# Patient Record
Sex: Male | Born: 1980 | Race: White | Hispanic: No | Marital: Single | Smoking: Never smoker
Health system: Southern US, Community
[De-identification: ages and names within clinical notes are randomized; demographics above are authoritative.]

## PROBLEM LIST (undated history)

## (undated) DIAGNOSIS — IMO0002 Reserved for concepts with insufficient information to code with codable children: Secondary | ICD-10-CM

## (undated) DIAGNOSIS — T7840XA Allergy, unspecified, initial encounter: Secondary | ICD-10-CM

## (undated) DIAGNOSIS — A4902 Methicillin resistant Staphylococcus aureus infection, unspecified site: Secondary | ICD-10-CM

## (undated) HISTORY — DX: Methicillin resistant Staphylococcus aureus infection, unspecified site: A49.02

## (undated) HISTORY — PX: WISDOM TOOTH EXTRACTION: SHX21

## (undated) HISTORY — DX: Allergy, unspecified, initial encounter: T78.40XA

## (undated) HISTORY — PX: HAND SURGERY: SHX662

---

## 2006-09-12 ENCOUNTER — Emergency Department (HOSPITAL_COMMUNITY): Admission: EM | Admit: 2006-09-12 | Discharge: 2006-09-13 | Payer: Self-pay | Admitting: Emergency Medicine

## 2006-10-15 ENCOUNTER — Emergency Department (HOSPITAL_COMMUNITY): Admission: EM | Admit: 2006-10-15 | Discharge: 2006-10-15 | Payer: Self-pay | Admitting: Emergency Medicine

## 2006-11-19 ENCOUNTER — Ambulatory Visit: Payer: Self-pay | Admitting: Internal Medicine

## 2006-11-20 ENCOUNTER — Ambulatory Visit: Payer: Self-pay | Admitting: Internal Medicine

## 2008-02-25 DIAGNOSIS — J45909 Unspecified asthma, uncomplicated: Secondary | ICD-10-CM | POA: Insufficient documentation

## 2008-02-25 DIAGNOSIS — J309 Allergic rhinitis, unspecified: Secondary | ICD-10-CM | POA: Insufficient documentation

## 2008-02-26 ENCOUNTER — Ambulatory Visit: Payer: Self-pay | Admitting: Internal Medicine

## 2008-02-26 DIAGNOSIS — R079 Chest pain, unspecified: Secondary | ICD-10-CM

## 2008-10-18 ENCOUNTER — Emergency Department: Payer: Self-pay | Admitting: Emergency Medicine

## 2008-10-20 ENCOUNTER — Ambulatory Visit: Payer: Self-pay | Admitting: General Practice

## 2009-01-22 ENCOUNTER — Emergency Department (HOSPITAL_COMMUNITY): Admission: EM | Admit: 2009-01-22 | Discharge: 2009-01-22 | Payer: Self-pay | Admitting: Emergency Medicine

## 2010-07-17 ENCOUNTER — Emergency Department (HOSPITAL_COMMUNITY)
Admission: EM | Admit: 2010-07-17 | Discharge: 2010-07-17 | Payer: Self-pay | Source: Home / Self Care | Admitting: Emergency Medicine

## 2010-09-12 ENCOUNTER — Other Ambulatory Visit: Payer: Self-pay | Admitting: Otolaryngology

## 2010-10-02 LAB — POCT CARDIAC MARKERS: Myoglobin, poc: 65.1 ng/mL (ref 12–200)

## 2010-10-02 LAB — POCT I-STAT, CHEM 8
Calcium, Ion: 1.14 mmol/L (ref 1.12–1.32)
HCT: 45 % (ref 39.0–52.0)
Sodium: 140 mEq/L (ref 135–145)
TCO2: 26 mmol/L (ref 0–100)

## 2010-12-08 NOTE — Assessment & Plan Note (Signed)
[facility]                             PULMONARY OFFICE NOTE   NAME:CLAPPFernandez, Kenley                         MRN:          [id number]  DATE:11/19/2006                            DOB:          15-Feb-1981    REFERRING PHYSICIAN:  Cristy Hilts. Jacinto Halim, MD   PULMONARY CONSULTATION:   HISTORY:  [age]-year-old white male who reports that he had  difficulty with speed-skating dating all the way back to age 30 and  never had the wind his peers did.  In his teens, he began having  itching and sneezing most in the spring and seemed somewhat better after  using Claritin or Allegra.  He started smoking in his teenage years and  stopped smoking in January 2008 after developing paroxysms of dyspnea  associated with cough and chest discomfort during coughing paroxysms  that have improved somewhat after albuterol for several hours at a  time.  He underwent a cardiac evaluation for his chest pain and was  found to have a normal echo and stress test and is therefore seen at Dr.  Verl Dicker request for further evaluation of dyspnea, chest pain and cough.   PAST MEDICAL HISTORY:  Significant for the problems as outlined above.   ALLERGIES:  PENICILLIN.   MEDICATIONS:  1. Singulair 10 mg daily (which he thinks helps his rhinitis and      reduces his Claritin need, but does not improve his breathing).  2. Albuterol, which helps his breathing, but only for a few hours.   SOCIAL HISTORY:  He quit smoking in January 2008 and works in Teacher, English as a foreign language.   FAMILY HISTORY:  Significant for emphysema in maternal grandmother and  paternal grandmother.  His paternal grandfather had lung cancer.  They  were all smokers.  His mother has lupus.   REVIEW OF SYSTEMS:  Taken in detail on the worksheet and negative,  except for the fact that he does become numb in his extremities during  paroxysms of dyspnea.   PHYSICAL EXAMINATION:  This is an ambulatory white male who is a very  difficult and evasive historian and with somewhat of a peculiar affect  and attitude.  He is afebrile with stable vital signs.  HEENT:  Unremarkable.  Oropharynx is clear.  Dentition is intact.  Nasal  mucous membranes normal.  Ear canals are clear bilaterally.  NECK:  Supple without cervical adenopathy or tenderness.  Trachea is  midline.  No thyromegaly.  Lung fields are perfectly clear bilaterally to auscultation and  percussion and note that he has not used any albuterol in the last 24  hours.  CARDIAC:  Regular rate and rhythm without murmur, gallop or rub.  ABDOMEN:  Soft and benign.  EXTREMITIES:  Warm without calf tenderness, cyanosis, clubbing or edema.   IMPRESSION:  1. Lifelong poor exercise tolerance is probably nothing more than      conditioning, but may indicate a component of either chronic asthma      or exercise-induced asthma.  He has not actually ever tried using      albuterol right  before he exercises to see if there is any benefit.  2. Atypical chest pain midline and worse with coughing; it is probably      either musculoskeletal or reflux-related.  Reflux would be a nice      unifying diagnosis, as it might explain exercise-induced asthma      as well as the chest discomfort and the chronic cough.   I therefore recommended that he start AcipHex 20 mg daily before  breakfast for at least 2 weeks before returning for a methacholine  challenge test to definitely say whether or not asthma is present.  In  the meantime, he can certainly continue to use albuterol as needed; I  spent extra time teaching him how to use this effectively, but cautioned  him not to use it before he comes in for the methacholine challenge  test.   The fact that he gets numb around his mouth and that he gets paroxysms  of dyspnea that are not directly related to activity, season or time of  day or night associated with numbness also suggests to me, in addition  to the fact that he has an  unusual affect, that he may be  hyperventilating.  Of course, he may be hyperventilating related to the  sense that he is developing bronchospasm and so it is critical to help  this patient understand the difference between true asthma and  hyperventilation; a methacholine challenge test will serve this purpose  nicely and I have recommended that he proceed with it after 2 weeks of  acid suppression with AcipHex to reduce the risk of a false-positive  methacholine based on chronic reflux.     Charlaine Dalton. Sherene Sires, MD, [facility]  Electronically Signed    MBW/MedQ  DD: 11/19/2006  DT: 11/20/2006  Job #: [id number]   cc:   Cristy Hilts. Jacinto Halim, MD  [facility]

## 2012-05-01 ENCOUNTER — Ambulatory Visit (INDEPENDENT_AMBULATORY_CARE_PROVIDER_SITE_OTHER): Payer: 59 | Admitting: Family Medicine

## 2012-05-01 ENCOUNTER — Ambulatory Visit: Payer: 59

## 2012-05-01 VITALS — BP 124/70 | HR 60 | Temp 97.8°F | Resp 16 | Ht 70.0 in | Wt 152.0 lb

## 2012-05-01 DIAGNOSIS — R42 Dizziness and giddiness: Secondary | ICD-10-CM

## 2012-05-01 DIAGNOSIS — R51 Headache: Secondary | ICD-10-CM

## 2012-05-01 DIAGNOSIS — R110 Nausea: Secondary | ICD-10-CM

## 2012-05-01 DIAGNOSIS — R0602 Shortness of breath: Secondary | ICD-10-CM

## 2012-05-01 DIAGNOSIS — R079 Chest pain, unspecified: Secondary | ICD-10-CM

## 2012-05-01 LAB — POCT CBC
Granulocyte percent: 59.1 %G (ref 37–80)
HCT, POC: 50.3 % (ref 43.5–53.7)
Hemoglobin: 17.2 g/dL (ref 14.1–18.1)
Lymph, poc: 2.4 (ref 0.6–3.4)
MCH, POC: 32.8 pg — AB (ref 27–31.2)
MCHC: 34.2 g/dL (ref 31.8–35.4)
MCV: 95.9 fL (ref 80–97)
MID (cbc): 0.4 (ref 0–0.9)
MPV: 8.4 fL (ref 0–99.8)
POC Granulocyte: 4.1 (ref 2–6.9)
POC LYMPH PERCENT: 35.1 % (ref 10–50)
POC MID %: 5.8 %M (ref 0–12)
Platelet Count, POC: 281 10*3/uL (ref 142–424)
RBC: 5.25 M/uL (ref 4.69–6.13)
RDW, POC: 12.3 %
WBC: 6.9 10*3/uL (ref 4.6–10.2)

## 2012-05-01 LAB — POCT URINALYSIS DIPSTICK
Bilirubin, UA: NEGATIVE
Blood, UA: NEGATIVE
Glucose, UA: NEGATIVE
Ketones, UA: NEGATIVE
Leukocytes, UA: NEGATIVE
Nitrite, UA: NEGATIVE
Protein, UA: NEGATIVE
Spec Grav, UA: 1.01
Urobilinogen, UA: 0.2
pH, UA: 6.5

## 2012-05-01 LAB — POCT UA - MICROSCOPIC ONLY
Bacteria, U Microscopic: NEGATIVE
Casts, Ur, LPF, POC: NEGATIVE
Crystals, Ur, HPF, POC: NEGATIVE
Epithelial cells, urine per micros: NEGATIVE
Mucus, UA: NEGATIVE
RBC, urine, microscopic: NEGATIVE
WBC, Ur, HPF, POC: NEGATIVE
Yeast, UA: NEGATIVE

## 2012-05-01 LAB — GLUCOSE, POCT (MANUAL RESULT ENTRY): POC Glucose: 83 mg/dl (ref 70–99)

## 2012-05-01 NOTE — Progress Notes (Signed)
[facility]:  Office Visit  Chief Complaint:  Chief Complaint  Patient presents with  . Dizziness    Pt at work earlier and felt very nauseated, jittery, lightheaded- pulse was in the 50's  . Headache  . Numbness    Lt arm numbness    HPI: William Oliver is a 31 y.o. male who complains of  Acute onset of dizziness and nausea this morning at work at 11:30 and started having blurred vision , headache, left anterior wall chest pain, numbness, tingling, generalized weakness lasting several seconds to minutes. Patient is currently asymptomatic.  Denies personal h/o tobacco use, HTN, DM, or XOL. Has a materna uncle who passed away from MI at age 48. BP was 128/80 and pulse 63 when measure at work.  Eating and drinking well. Denies drug use either illicit or prescribed.  He has never had sxs like this before but has had localized anterior chest wall pain in 2008/2009 where he got a stress test which was negative. He also had seen a pulmonologist for a 2008 xray which stated that he had COPD. The pulmonogist did not think he had/has COPD per patient and they parted ways. He has been a non smoker. There is a family hx of COPD, father and grandfather who are both welders and are life long non-smokers. Father has throat cancer and grandfather had lung cancer.   History reviewed. No pertinent past medical history. History reviewed. No pertinent past surgical history. History   Social History  . Marital Status: Single    Spouse Name: N/A    Number of Children: N/A  . Years of Education: N/A   Social History Main Topics  . Smoking status: Never Smoker   . Smokeless tobacco: None  . Alcohol Use: No  . Drug Use: No  . Sexually Active: None   Other Topics Concern  . None   Social History Narrative  . None   Family History  Problem Relation Age of Onset  . Lupus Mother   . COPD Father   . Heart disease Maternal Uncle    Allergies  Allergen Reactions  . Penicillins     Prior to Admission medications   Not on File     ROS: The patient denies fevers, chills, night sweats, unintentional weight loss,  wheezing, dyspnea on exertion,vomiting, abdominal pain, dysuria, hematuria, melena; + increase urinary frequency, CP, dizziness, numbness, weakness, or tingling.   All other systems have been reviewed and were otherwise negative with the exception of those mentioned in the HPI and as above.    PHYSICAL EXAM: Filed Vitals:   05/01/12 1454  BP: 124/70  Pulse:   Temp:   Resp:    Filed Vitals:   05/01/12 1412  Height: 5\' 10"  (1.778 m)  Weight: 152 lb (68.947 kg)   Body mass index is 21.81 kg/(m^2).  General: Alert, no acute distress.  Affect a little strange but otherwise ok. HEENT:  Normocephalic, atraumatic, oropharynx patent. EOMI, fundoscopic exam nl Cardiovascular:  Regular rate and rhythm, no rubs murmurs or gallops.  No Carotid bruits, radial pulse intact. No pedal edema.  Respiratory: Clear to auscultation bilaterally.  No wheezes, rales, or rhonchi.  No cyanosis, no use of accessory musculature GI: No organomegaly, abdomen is soft and non-tender, positive bowel sounds.  No masses. Skin: No rashes. Neurologic: Facial musculature symmetric. Psychiatric: Patient is appropriate throughout our interaction. Lymphatic: No cervical lymphadenopathy Musculoskeletal: Gait intact.   LABS: Results for orders placed  in visit on 05/01/12  POCT CBC      Component Value Range   WBC 6.9  4.6 - 10.2 K/uL   Lymph, poc 2.4  0.6 - 3.4   POC LYMPH PERCENT 35.1  10 - 50 %L   MID (cbc) 0.4  0 - 0.9   POC MID % 5.8  0 - 12 %M   POC Granulocyte 4.1  2 - 6.9   Granulocyte percent 59.1  37 - 80 %G   RBC 5.25  4.69 - 6.13 M/uL   Hemoglobin 17.2  14.1 - 18.1 g/dL   HCT, POC 29.5  62.1 - 53.7 %   MCV 95.9  80 - 97 fL   MCH, POC 32.8 (*) 27 - 31.2 pg   MCHC 34.2  31.8 - 35.4 g/dL   RDW, POC 30.8     Platelet Count, POC 281  142 - 424 K/uL   MPV 8.4  0 -  99.8 fL  POCT UA - MICROSCOPIC ONLY      Component Value Range   WBC, Ur, HPF, POC neg     RBC, urine, microscopic neg     Bacteria, U Microscopic neg     Mucus, UA neg     Epithelial cells, urine per micros neg     Crystals, Ur, HPF, POC neg     Casts, Ur, LPF, POC neg     Yeast, UA neg    POCT URINALYSIS DIPSTICK      Component Value Range   Color, UA yellow     Clarity, UA clear     Glucose, UA neg     Bilirubin, UA neg     Ketones, UA neg     Spec Grav, UA 1.010     Blood, UA neg     pH, UA 6.5     Protein, UA neg     Urobilinogen, UA 0.2     Nitrite, UA neg     Leukocytes, UA Negative    GLUCOSE, POCT (MANUAL RESULT ENTRY)      Component Value Range   POC Glucose 83  70 - 99 mg/dl     EKG/XRAY:   Primary read interpreted by Dr. Conley Rolls at [facility]. Sinus brady otherwise no ST-T wave abnormalites No acute cardiopulmonary process  On CXR, unchanged from prior  ASSESSMENT/PLAN: Encounter Diagnoses  Name Primary?  . Chest pain Yes  . Nausea   . Dizziness   . Headache   . SOB (shortness of breath)    1. Chest pain-acute on chronic d/t costchondritis vs pulmonary vs less likely cardiac. Cardiac workup in 2009  Negative. Go to ER prn worsening CP. EKG sinus brady-young and exercising. Will refer him back to [facility] for PFTs if he has not done it. He is  slightly fixated on dx of COPD on xray from 2008 although has been told by pulmonology that is not likely. 2. Dizzness-since 1 episode for a few seconds without LOC  then will ask to monitor.  3. H/O COPD? Will refer to pulmonolgy for definitve diagnosis. He had a dx of COPD based on xray and not any other pulmonary testing based from what he can tell me. He wants to go see another pulmonologist. Was supposed to get a PFT? With Dr. Sherene Sires but never did. Will refer him to Dr. Stann Mainland.     Emer Onnen PHUONG, DO 05/01/2012 3:59 PM

## 2012-05-02 LAB — COMPREHENSIVE METABOLIC PANEL
AST: 23 U/L (ref 0–37)
Albumin: 5.1 g/dL (ref 3.5–5.2)
Alkaline Phosphatase: 51 U/L (ref 39–117)
Glucose, Bld: 85 mg/dL (ref 70–99)
Potassium: 4.3 mEq/L (ref 3.5–5.3)
Sodium: 138 mEq/L (ref 135–145)
Total Protein: 7.9 g/dL (ref 6.0–8.3)

## 2012-05-02 LAB — COMPREHENSIVE METABOLIC PANEL WITH GFR
ALT: 17 U/L (ref 0–53)
BUN: 15 mg/dL (ref 6–23)
CO2: 31 meq/L (ref 19–32)
Calcium: 10 mg/dL (ref 8.4–10.5)
Chloride: 100 meq/L (ref 96–112)
Creat: 0.91 mg/dL (ref 0.50–1.35)
Total Bilirubin: 1 mg/dL (ref 0.3–1.2)

## 2012-05-02 LAB — TSH: TSH: 1.049 u[IU]/mL (ref 0.350–4.500)

## 2012-05-09 ENCOUNTER — Ambulatory Visit (INDEPENDENT_AMBULATORY_CARE_PROVIDER_SITE_OTHER): Payer: 59 | Admitting: Internal Medicine

## 2012-05-09 ENCOUNTER — Encounter: Payer: Self-pay | Admitting: Internal Medicine

## 2012-05-09 VITALS — BP 114/70 | HR 75 | Temp 97.6°F | Ht 71.0 in | Wt 151.0 lb

## 2012-05-09 DIAGNOSIS — R079 Chest pain, unspecified: Secondary | ICD-10-CM

## 2012-05-09 DIAGNOSIS — R05 Cough: Secondary | ICD-10-CM

## 2012-05-09 DIAGNOSIS — R0600 Dyspnea, unspecified: Secondary | ICD-10-CM

## 2012-05-09 DIAGNOSIS — R0989 Other specified symptoms and signs involving the circulatory and respiratory systems: Secondary | ICD-10-CM

## 2012-05-09 MED ORDER — FAMOTIDINE 20 MG PO TABS: ORAL_TABLET | ORAL | Status: DC

## 2012-05-09 MED ORDER — PANTOPRAZOLE SODIUM 40 MG PO TBEC: 40.0000 mg | DELAYED_RELEASE_TABLET | Freq: Every day | ORAL | Status: DC

## 2012-05-09 NOTE — Patient Instructions (Addendum)
Stop lodine  Try Protonix 40 mg (pantoprazole) 40 mg  Take 30-60 min before first meal of the day and Pepcid 20 mg one bedtime until  Return  GERD (REFLUX)  is an extremely common cause of respiratory symptoms including cough and short of breath, many times with no significant heartburn at all.    It can be treated with medication, but also with lifestyle changes including avoidance of late meals, excessive alcohol, smoking cessation, and avoid fatty foods, chocolate, peppermint, colas, red wine, and acidic juices such as orange juice.  NO MINT OR MENTHOL PRODUCTS SO NO COUGH DROPS  USE SUGARLESS CANDY INSTEAD (jolley ranchers or Stover's)  NO OIL BASED VITAMINS - use powdered substitutes.    Classic for splenic flexure syndrome  very limited distribution of pain locations, daytime, not exacerbated by ex or coughing, worse in sitting position, associated with generalized abd bloating, not present supine due to the dome effect of the diaphragm is  canceled in that position. Frequently these patients have had multiple negative GI workups and CT scans.  Treatment consists of avoiding foods that cause gas (especially beans and raw vegetables like spinach and salads)  and citrucel 1 heaping tsp twice daily with a large glass of water.  Pain should improve w/in 2 weeks.  Please see patient coordinator before you leave today  to schedule sinus CT   Please schedule a follow up office visit in 2 weeks, sooner if needed with meds in hand

## 2012-05-09 NOTE — Progress Notes (Signed)
Subjective:    Patient ID: William Oliver, male    DOB: November 14, 1980  MRN: [id number]  HPI  31 yowm quit smoking 07/2006 who reports that he had  difficulty with "speed-skating" dating all the way back to age 31 and  never had the "wind" his peers did. In his teens, he began having  itching and sneezing most in the spring and seemed somewhat better after  using Claritin or Allegra. He started smoking in his teenage years and  stopped smoking in January 2008 after developing paroxysms of dyspnea  associated with cough and chest discomfort during coughing paroxysms  that have "improved somewhat" after albuterol for several hours at a  time. He underwent a cardiac evaluation for his chest pain and was  found to have a normal echo and stress test and is therefore seen at Dr.  Verl Dicker request for further evaluation of dyspnea, chest pain and cough.   11/19/06 1st pulmonary eval initially: dx upper airway cough syndrome either reflux or sinus related. Sinus CT scan on 11/20/07 was positive for maxillary sinusitis and he remembers taking antibiotics but not whether this helped any of his symptoms and did not return for follow-up because he ran out of insurance.   February 26, 2008 reports multiple complaints that are gradually worsening:  doe more than walk with dog , like jogging with dog > 5 min , without reproducible cp  siitting still also looses breath brought on by couging sometimes, several times a day, sometimes better with inhaler, sometimes not, out of inihaler x 1 week no worse  cough comes and goes wakes up at night , sometimes yellow mucus, no change with 2 weeks of abx for tooth infection and has two more pills left and had upper tooth pulled 02/26/08  cp comes and goes sev times worse with coughing also comes and goes without coughing from sev few minutes to a few hours never wakes up or with ex.  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 3) CP L of midline "constantly present" x  sometimes eases down when supine some worse with deep breath present x years, ? Worse sitting and bending over at work. No better on or off lodine  No obvious daytime variabilty or assoc  subjective wheeze overt sinus or hb symptoms. No unusual exp hx or h/o childhood pna/ asthma or premature birth to his knowledge.   Sleeping ok without nocturnal  or early am exacerbation  of respiratory  c/o's or need for noct saba. Also denies any obvious fluctuation of symptoms with weather or environmental changes or other aggravating or alleviating factors except as outlined above          PMHX Allergic Rhinitis  Asthma    Family History:   emphysema- mother, grandmother,siser  cancer-lung-grandfather  lupus-mother  Social History:  Reviewed history from 02/25/2008 and no changes required:  lives with dad  single, no children  quit smoking 1/08 worse since  Review of Systems  Constitutional: Negative for fever, chills, activity change, appetite change and unexpected weight change.  HENT: Positive for congestion. Negative for sore throat, rhinorrhea, sneezing, trouble swallowing, dental problem, voice change and postnasal drip.   Eyes: Negative for visual disturbance.  Respiratory: Positive for cough and shortness of breath. Negative for choking.   Cardiovascular: Positive for chest pain. Negative for leg swelling.  Gastrointestinal: Negative for nausea, vomiting and abdominal pain.  Genitourinary: Negative for difficulty urinating.    Musculoskeletal: Positive for arthralgias.  Skin: Negative for rash.  Psychiatric/Behavioral: Negative for behavioral problems and confusion.       Objective:   Physical Exam  Wt 151 05/09/12 somber and anxious ambulatory white male easily frustrated answering multiple symptom related questions  HEENT: nl dentition, turbinates, and orophanx. Nl external ear canals without cough reflex  Neck without JVD/Nodes/TM  Lungs clear to A and P bilaterally without cough on insp or exp maneuvers  RRR no s3 or murmur or increase in P2  Abd soft and benign with nl excursion in the supine position. No bruits or organomegaly  Ext warm without calf tenderness, cyanosis clubbing or edema  Skin warm and dry without lesions   05/01/12 cxr Negative, no acute cardiopulmonary abnormality     Assessment & Plan:

## 2012-05-11 DIAGNOSIS — R0600 Dyspnea, unspecified: Secondary | ICD-10-CM | POA: Insufficient documentation

## 2012-05-11 NOTE — Assessment & Plan Note (Signed)
Symptoms are markedly disproportionate to objective findings and not clear this is a lung problem but pt does appear to have difficult airway management issues. DDX of  difficult airways managment all start with A and  include Adherence, Ace Inhibitors, Acid Reflux, Active Sinus Disease, Alpha 1 Antitripsin deficiency, Anxiety masquerading as Airways dz,  ABPA,  allergy(esp in young), Aspiration (esp in elderly), Adverse effects of DPI,  Active smokers, plus two Bs  = Bronchiectasis and Beta blocker use..and one C= CHF  ? Acid reflux > repeat rx and stop lodine  ? Anxiety/depresssion> usually a dx of exclusion > note symptoms all worsened after stopped smoking so this raises the issue to a higher level but defer rx to Dr Conley Rolls  Does need baseline pft's as prev recommended

## 2012-05-11 NOTE — Assessment & Plan Note (Signed)
Classic subdiaphragmatic pain pattern suggests ibs:  Stereotypical,  chronic with a very limited distribution L anterior chest, daytime, not necessarily  exacerbated by ex or coughing, worse in sitting position, associated with generalized abd bloating, not present supine due to the dome effect of the diaphragm is  canceled in that position. Frequently these patients have had multiple negative GI workups and CT scans.  Treatment consists of avoiding foods that cause gas (especially beans and raw vegetables like spinach and salads)  and citrucel 1 heaping tsp twice daily with a large glass of water.  Pain should improve w/in 2 weeks and if not then consider further GI work up.

## 2012-05-11 NOTE — Assessment & Plan Note (Signed)
The most common causes of chronic cough in immunocompetent adults include the following: upper airway cough syndrome (UACS), previously referred to as postnasal drip syndrome (PNDS), which is caused by variety of rhinosinus conditions; (2) asthma; (3) GERD; (4) chronic bronchitis from cigarette smoking or other inhaled environmental irritants; (5) nonasthmatic eosinophilic bronchitis; and (6) bronchiectasis.   These conditions, singly or in combination, have accounted for up to 94% of the causes of chronic cough in prospective studies.   Other conditions have constituted no >6% of the causes in prospective studies These have included bronchogenic carcinoma, chronic interstitial pneumonia, sarcoidosis, left ventricular failure, ACEI-induced cough, and aspiration from a condition associated with pharyngeal dysfunction.   Most likely this is  Classic Upper airway cough syndrome, so named because it's frequently impossible to sort out how much is  CR/sinusitis with freq throat clearing (which can be related to primary GERD)   vs  causing  secondary (" extra esophageal")  GERD from wide swings in gastric pressure that occur with throat clearing, often  promoting self use of mint and menthol lozenges that reduce the lower esophageal sphincter tone and exacerbate the problem further in a cyclical fashion.   These are the same pts (now being labeled as having "irritable larynx syndrome" by some cough centers) who not infrequently have a history of having failed to tolerate ace inhibitors,  dry powder inhalers or biphosphonates or report having atypical reflux symptoms that don't respond to standard doses of PPI , and are easily confused as having aecopd or asthma flares by even experienced allergists/ pulmonologists.   Needs trial off ppi qam, h2 hs and repeat sinus ct as did previously have sign occult sinus dz

## 2012-05-13 ENCOUNTER — Other Ambulatory Visit: Payer: 59

## 2012-05-14 ENCOUNTER — Ambulatory Visit (INDEPENDENT_AMBULATORY_CARE_PROVIDER_SITE_OTHER)
Admission: RE | Admit: 2012-05-14 | Discharge: 2012-05-14 | Disposition: A | Discharge status: Home / Self Care | Payer: 59 | Source: Ambulatory Visit | Attending: Internal Medicine | Admitting: Internal Medicine

## 2012-05-14 DIAGNOSIS — R05 Cough: Secondary | ICD-10-CM

## 2012-05-15 ENCOUNTER — Encounter: Payer: Self-pay | Admitting: Internal Medicine

## 2012-05-22 ENCOUNTER — Encounter: Payer: Self-pay | Admitting: Family Medicine

## 2012-05-27 ENCOUNTER — Ambulatory Visit (INDEPENDENT_AMBULATORY_CARE_PROVIDER_SITE_OTHER): Payer: 59 | Admitting: Internal Medicine

## 2012-05-27 ENCOUNTER — Encounter: Payer: Self-pay | Admitting: Internal Medicine

## 2012-05-27 VITALS — BP 112/78 | HR 54 | Temp 98.1°F | Ht 71.5 in | Wt 160.6 lb

## 2012-05-27 DIAGNOSIS — R0600 Dyspnea, unspecified: Secondary | ICD-10-CM

## 2012-05-27 DIAGNOSIS — R05 Cough: Secondary | ICD-10-CM

## 2012-05-27 DIAGNOSIS — R0989 Other specified symptoms and signs involving the circulatory and respiratory systems: Secondary | ICD-10-CM

## 2012-05-27 DIAGNOSIS — R079 Chest pain, unspecified: Secondary | ICD-10-CM

## 2012-05-27 NOTE — Patient Instructions (Addendum)
Citrucel one heaping tsp twice daily with large glass of water.  Zostrix cream 4 x daily x one week and if improve taper to once daily  Call for appt  2 weeks if not improving.

## 2012-05-27 NOTE — Progress Notes (Signed)
Subjective:    Patient ID: William Oliver, male    DOB: Oct 06, 1980  MRN: [id number]  HPI  31 yowm quit smoking 07/2006 who reports that he had  difficulty with "speed-skating" dating all the way back to age 31 and  never had the "wind" his peers did. In his teens, he began having  itching and sneezing most in the spring and seemed somewhat better after  using Claritin or Allegra. He started smoking in his teenage years and  stopped smoking in January 2008 after developing paroxysms of dyspnea  associated with cough and chest discomfort during coughing paroxysms  that have "improved somewhat" after albuterol for several hours at a  time. He underwent a cardiac evaluation for his chest pain and was  found to have a normal echo and stress test and is therefore seen at Dr.  Verl Dicker request for further evaluation of dyspnea, chest pain and cough.   11/19/06 1st pulmonary eval initially: dx upper airway cough syndrome either reflux or sinus related. Sinus CT scan on 11/20/07 was positive for maxillary sinusitis and he remembers taking antibiotics but not whether this helped any of his symptoms and did not return for follow-up because he ran out of insurance.   February 26, 2008 reports multiple complaints that are gradually worsening:  doe more than walk with dog , like jogging with dog > 5 min , without reproducible cp  siitting still also looses breath brought on by couging sometimes, several times a day, sometimes better with inhaler, sometimes not, out of inihaler x 1 week no worse  cough comes and goes wakes up at night , sometimes yellow mucus, no change with 2 weeks of abx for tooth infection and has two more pills left and had upper tooth pulled 02/26/08  cp comes and goes sev times worse with coughing also comes and goes without coughing from sev few minutes to a few hours never wakes up or with ex.  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 3) CP L of midline "constantly present" x  sometimes eases down when supine some worse with deep breath present x years, ? Worse sitting and bending over at work. No better on or off lodine rec Stop lodine Try Protonix 40 mg (pantoprazole) 40 mg  Take 30-60 min before first meal of the day and Pepcid 20 mg one bedtime until  Return GERD diet Classic for splenic flexure syndrome   Treatment consists of avoiding foods that cause gas (especially beans and raw vegetables like spinach and salads)  and citrucel 1 heaping tsp twice daily with a large glass of water.  Pain should improve w/in 2 weeks.  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  No obvious daytime variabilty or assoc  subjective wheeze overt sinus or hb symptoms. No unusual exp hx or h/o childhood pna/ asthma or premature birth to his knowledge.   Sleeping ok without nocturnal  or early am exacerbation  of respiratory  c/o's or need for noct saba. Also denies any obvious fluctuation of symptoms with weather or environmental changes or other aggravating or  alleviating factors except as outlined above   ROS  The following are not active complaints unless bolded sore throat, dysphagia, dental problems, itching, sneezing,  nasal congestion or excess/ purulent secretions, ear ache,   fever, chills, sweats, unintended wt loss, pleuritic or exertional cp, hemoptysis,  orthopnea pnd or leg swelling, presyncope, palpitations, heartburn, abdominal pain, anorexia, nausea, vomiting, diarrhea  or change in bowel or urinary habits, change in stools or urine, dysuria,hematuria,  rash, arthralgias, visual complaints, headache, numbness weakness or ataxia or problems with walking or coordination,  change in mood/affect or memory.             PMHX Allergic Rhinitis  Asthma    Family History:   emphysema- mother, grandmother,siser  cancer-lung-grandfather  lupus-mother   Social History:  lives with dad  single, no children  quit smoking 1/08 worse since                   Objective:   Physical Exam  Wt 151 05/09/12 > 05/27/2012  89 somber and anxious ambulatory white male easily frustrated answering multiple symptom related questions and didn't answer a single one in a straightforward manner HEENT: nl dentition, turbinates, and orophanx. Nl external ear canals without cough reflex  Neck without JVD/Nodes/TM  Lungs clear to A and P bilaterally without cough on insp or exp maneuvers  RRR no s3 or murmur or increase in P2  Abd soft and benign with nl excursion in the supine position. No bruits or organomegaly  Ext warm without calf tenderness, cyanosis clubbing or edema  Skin warm and dry without lesions   05/01/12 cxr Negative, no acute cardiopulmonary abnormality     Assessment & Plan:

## 2012-05-28 NOTE — Assessment & Plan Note (Signed)
-   Sinus Ct 05/14/12> Mild chronic sinusitis, no acute changes    - Better on GERD Rx 05/28/2012 > rec maint rx  Most likely this is  Classic Upper airway cough syndrome, so named because it's frequently impossible to sort out how much is  CR/sinusitis with freq throat clearing (which can be related to primary GERD)   vs  causing  secondary (" extra esophageal")  GERD from wide swings in gastric pressure that occur with throat clearing, often  promoting self use of mint and menthol lozenges that reduce the lower esophageal sphincter tone and exacerbate the problem further in a cyclical fashion.   These are the same pts (now being labeled as having "irritable larynx syndrome" by some cough centers) who not infrequently have a history of having failed to tolerate ace inhibitors,  dry powder inhalers or biphosphonates or report having atypical reflux symptoms that don't respond to standard doses of PPI , and are easily confused as having aecopd or asthma flares by even experienced allergists/ pulmonologists.     Each maintenance medication was reviewed in detail including most importantly the difference between maintenance and as needed and under what circumstances the prns are to be used.  Please see instructions for details which were reviewed in writing and the patient given a copy.

## 2012-05-28 NOTE — Assessment & Plan Note (Signed)
Unable to reproduced this chronic "persistent" complaint in this building > back slowed him before his breathing.  Will likely need cpst at some point.

## 2012-05-28 NOTE — Assessment & Plan Note (Signed)
Now describes 2 different pains x 5 years  1) L of midlline above L Breast 24 h per day ? Better supine, worse crouched, ? Goes away when the other pain starts (ie migratory)  2) Below L breast, sev times weekly, sev hours each, never while supine  ddx is neuralgia pattern (? Chest wall injury from cough, his initial presentation in 2008) vs IBS > can't r/o unless he follows diet.

## 2012-06-05 ENCOUNTER — Ambulatory Visit (INDEPENDENT_AMBULATORY_CARE_PROVIDER_SITE_OTHER): Payer: 59 | Admitting: Emergency Medicine

## 2012-06-05 ENCOUNTER — Ambulatory Visit: Payer: 59

## 2012-06-05 VITALS — BP 126/76 | HR 90 | Temp 99.3°F | Resp 17 | Ht 70.5 in | Wt 152.0 lb

## 2012-06-05 DIAGNOSIS — R079 Chest pain, unspecified: Secondary | ICD-10-CM

## 2012-06-05 DIAGNOSIS — R509 Fever, unspecified: Secondary | ICD-10-CM

## 2012-06-05 DIAGNOSIS — R05 Cough: Secondary | ICD-10-CM

## 2012-06-05 DIAGNOSIS — H6690 Otitis media, unspecified, unspecified ear: Secondary | ICD-10-CM

## 2012-06-05 LAB — POCT CBC
Granulocyte percent: 75.3 %G (ref 37–80)
MID (cbc): 0.7 (ref 0–0.9)
MPV: 8.3 fL (ref 0–99.8)
POC MID %: 6.3 %M (ref 0–12)
Platelet Count, POC: 288 10*3/uL (ref 142–424)
RBC: 5.51 M/uL (ref 4.69–6.13)

## 2012-06-05 MED ORDER — HYDROCOD POLST-CHLORPHEN POLST 10-8 MG/5ML PO LQCR: 5.0000 mL | Freq: Two times a day (BID) | ORAL | Status: DC | PRN

## 2012-06-05 MED ORDER — AZITHROMYCIN 250 MG PO TABS: ORAL_TABLET | ORAL | Status: DC

## 2012-06-05 MED ORDER — BENZONATATE 100 MG PO CAPS: 100.0000 mg | ORAL_CAPSULE | Freq: Three times a day (TID) | ORAL | Status: DC | PRN

## 2012-06-05 NOTE — Progress Notes (Signed)
Subjective:    Patient ID: William Oliver, male    DOB: 08-30-1980, 31 y.o.   MRN: [id number]  HPI  William Oliver is a 31 yr old male with 4 days of sore throat, productive cough, and fever.  States symptoms began Sunday evening and have been worsening since.  No energy.  His cough is productive of green sputum.  He denies runny nose but has some nasal congestion and sinus pressure.  He is experiencing chest pain with deep inspiration.  Pain is located medially and does not radiate.  His sore throat is better than it was initially.  He has developed left ear pain.  Tmax at home 100.61F.  Has been using Dayquil, Nyquil, and Zicam which are helping to suppress the cough.    Denies smoking history but previous notes from pulmonology indicate he is a former smoker.  Review of Systems  Constitutional: Negative for fever and chills.  HENT: Positive for ear pain, congestion, sore throat and sinus pressure. Negative for rhinorrhea.   Respiratory: Positive for cough and shortness of breath. Negative for wheezing.   Cardiovascular: Positive for chest pain (with deep inspiration).  Gastrointestinal: Positive for nausea. Negative for vomiting and diarrhea.  Musculoskeletal: Negative.   Skin: Negative.   Neurological: Positive for dizziness, light-headedness and headaches. Negative for syncope.       Objective:   Physical Exam  Vitals reviewed. Constitutional: He is oriented to person, place, and time. He appears well-developed and well-nourished. No distress.  HENT:  Head: Normocephalic and atraumatic.  Right Ear: Ear canal normal. A middle ear effusion is present.  Left Ear: Ear canal normal. Tympanic membrane is erythematous and bulging.  Nose: Nose normal. Right sinus exhibits no maxillary sinus tenderness and no frontal sinus tenderness. Left sinus exhibits no maxillary sinus tenderness and no frontal sinus tenderness.  Mouth/Throat: Uvula is midline, oropharynx is clear and moist and mucous membranes  are normal.  Neck: Neck supple.  Cardiovascular: Normal rate, regular rhythm and normal heart sounds.  Exam reveals no gallop and no friction rub.   No murmur heard. Pulmonary/Chest: Effort normal and breath sounds normal. He has no wheezes. He has no rales. He exhibits no tenderness.  Lymphadenopathy:    He has no cervical adenopathy.  Neurological: He is alert and oriented to person, place, and time.  Skin: Skin is warm and dry.  Psychiatric: He has a normal mood and affect. His behavior is normal.     Filed Vitals:   06/05/12 0922  BP: 126/76  Pulse: 90  Temp: 99.3 F (37.4 C)  Resp: 17     Results for orders placed in visit on 06/05/12  POCT CBC      Component Value Range   WBC 11.6 (*) 4.6 - 10.2 K/uL   Lymph, poc 2.1  0.6 - 3.4   POC LYMPH PERCENT 18.4  10 - 50 %L   MID (cbc) 0.7  0 - 0.9   POC MID % 6.3  0 - 12 %M   POC Granulocyte 8.7 (*) 2 - 6.9   Granulocyte percent 75.3  37 - 80 %G   RBC 5.51  4.69 - 6.13 M/uL   Hemoglobin 17.3  14.1 - 18.1 g/dL   HCT, POC 40.9 (*) 81.1 - 53.7 %   MCV 98.1 (*) 80 - 97 fL   MCH, POC 31.4 (*) 27 - 31.2 pg   MCHC 32.0  31.8 - 35.4 g/dL   RDW, POC 13.1  Platelet Count, POC 288  142 - 424 K/uL   MPV 8.3  0 - 99.8 fL     [facility] reading (PRIMARY) by  Dr. Cleta Alberts - heavy interstitial markings at the bases, no consolidation.       Assessment & Plan:   1. Cough  POCT CBC, DG Chest 2 View, chlorpheniramine-HYDROcodone (TUSSIONEX PENNKINETIC ER) 10-8 MG/5ML LQCR, benzonatate (TESSALON) 100 MG capsule  2. Chest pain  DG Chest 2 View  3. Fever  POCT CBC  4. Otitis media  azithromycin (ZITHROMAX) 250 MG tablet    William Oliver is a 31 yr old male here with 4 days of productive cough, sore throat, and ear pain.  WBC is 11.6.  Left TM is erythematous and bulging.  Physical exam otherwise normal.  CXR shows no evidence of acute disease.  On chart review, it appears that the patient has a history of chronic cough/SOB/CP that has been  evaluated by both pulmonology and cardiology.  He was last seen by pulm on 05/27/12.  Per notes, the pt's follow- up has been intermittent, and he has not always been compliant with recommendations.  Etiology is unclear with questionable anxiety component.  The patient does not share any of this with me today.  But I am reassured that his chest pain is chronic and has been worked up.   Given pt's allergy to penicillin, will treat OM with azithromycin.  Discussed with pt that azithromycin should cover a respiratory infection as well though it does not appear that he has this at this time.  I have given him Tessalon and Tussionex for cough.  If pt is worsening or not improving he will RTC.

## 2012-06-05 NOTE — Patient Instructions (Addendum)
Take the ZPak as directed for your ear infection.  Make sure to finish the full course.  Use Tessalon for cough during the day and Tussionex for cough at night.  Do not use Tussionex during the day as it may make you sleepy.  Use cough drops as needed for sore throat.  Drink plenty of fluids and get plenty of rest.  If you are worsening or not improving, come back in.     Otitis Media, Adult A middle ear infection is an infection in the space behind the eardrum. The medical name for this is "otitis media." It may happen after a common cold. It is caused by a germ that starts growing in that space. You may feel swollen glands in your neck on the side of the ear infection. HOME CARE INSTRUCTIONS   Take your medicine as directed until it is gone, even if you feel better after the first few days.  Only take over-the-counter or prescription medicines for pain, discomfort, or fever as directed by your caregiver.  Occasional use of a nasal decongestant a couple times per day may help with discomfort and help the eustachian tube to drain better. Follow up with your caregiver in 10 to 14 days or as directed, to be certain that the infection has cleared. Not keeping the appointment could result in a chronic or permanent injury, pain, hearing loss and disability. If there is any problem keeping the appointment, you must call back to this facility for assistance. SEEK IMMEDIATE MEDICAL CARE IF:   You are not getting better in 2 to 3 days.  You have pain that is not controlled with medication.  You feel worse instead of better.  You cannot use the medication as directed.  You develop swelling, redness or pain around the ear or stiffness in your neck. MAKE SURE YOU:   Understand these instructions.  Will watch your condition.  Will get help right away if you are not doing well or get worse. Document Released: 04/13/2004 Document Revised: 10/01/2011 Document Reviewed: 02/13/2008 [facility] Patient  Information 2013 [place], [place].

## 2012-06-26 ENCOUNTER — Ambulatory Visit: Payer: 59 | Admitting: Internal Medicine

## 2013-01-12 DIAGNOSIS — Z79899 Other long term (current) drug therapy: Secondary | ICD-10-CM | POA: Insufficient documentation

## 2013-01-12 DIAGNOSIS — L738 Other specified follicular disorders: Secondary | ICD-10-CM | POA: Insufficient documentation

## 2013-01-12 DIAGNOSIS — L539 Erythematous condition, unspecified: Secondary | ICD-10-CM | POA: Insufficient documentation

## 2013-01-12 DIAGNOSIS — Z880 Allergy status to penicillin: Secondary | ICD-10-CM | POA: Insufficient documentation

## 2013-01-12 DIAGNOSIS — Z8614 Personal history of Methicillin resistant Staphylococcus aureus infection: Secondary | ICD-10-CM | POA: Insufficient documentation

## 2013-01-13 ENCOUNTER — Emergency Department (HOSPITAL_COMMUNITY)
Admission: EM | Admit: 2013-01-13 | Discharge: 2013-01-13 | Disposition: A | Discharge status: Home / Self Care | Payer: Managed Care, Other (non HMO) | Attending: Emergency Medicine | Admitting: Emergency Medicine

## 2013-01-13 ENCOUNTER — Encounter (HOSPITAL_COMMUNITY): Payer: Self-pay | Admitting: *Deleted

## 2013-01-13 DIAGNOSIS — L739 Follicular disorder, unspecified: Secondary | ICD-10-CM

## 2013-01-13 MED ORDER — DOXYCYCLINE HYCLATE 100 MG PO CAPS: 100.0000 mg | ORAL_CAPSULE | Freq: Two times a day (BID) | ORAL | Status: DC

## 2013-01-13 NOTE — ED Provider Notes (Signed)
   History    CSN: [id number] Arrival date & time 01/12/13  2353  First MD Initiated Contact with Patient 01/13/13 [phone]     Chief Complaint  Patient presents with  . Skin Problem   (Consider location/radiation/quality/duration/timing/severity/associated sxs/prior Treatment) HPI History provided by pt.   Pt noticed a lesion that looked like an insect bite on right side of neck yesterday morning.  As day went on, it became increasingly painful and surrounding erythema spread.  Associated w/ neck stiffness.  Denies fever, dyspnea and dysphagia.  History reviewed. No pertinent past medical history. Past Surgical History  Procedure Laterality Date  . Hand surgery     Family History  Problem Relation Age of Onset  . Lupus Mother   . COPD Father   . Heart disease Maternal Uncle   . Throat cancer Father     never smoker  . Lung cancer Paternal Emelia Loron     was a smoker  . Emphysema Maternal Grandmother   . Emphysema Paternal Grandfather   . Clotting disorder Maternal Grandmother    History  Substance Use Topics  . Smoking status: Never Smoker   . Smokeless tobacco: Never Used  . Alcohol Use: No    Review of Systems  All other systems reviewed and are negative.    Allergies  Penicillins  Home Medications   Current Outpatient Rx  Name  Route  Sig  Dispense  Refill  . HYDROcodone-acetaminophen (VICODIN) 5-500 MG per tablet   Oral   Take 1 tablet by mouth every 6 (six) hours as needed for pain (back pain).         Marland Kitchen doxycycline (VIBRAMYCIN) 100 MG capsule   Oral   Take 1 capsule (100 mg total) by mouth 2 (two) times daily.   20 capsule   0    BP 120/68  Pulse 62  Temp(Src) 98.3 F (36.8 C) (Oral)  Resp 18  Ht 5' 11.5" (1.816 m)  Wt 163 lb (73.936 kg)  BMI 22.42 kg/m2  SpO2 100% Physical Exam  Nursing note and vitals reviewed. Constitutional: He is oriented to person, place, and time. He appears well-developed and well-nourished. No distress.  HENT:   Head: Normocephalic and atraumatic.  Eyes:  Normal appearance  Neck: Normal range of motion. No tracheal deviation present.  1.5cm fixed, soft but non-fluctuant knot w/ poorly demarcated erythema, approx 3cm in diameter, on right lateral neck.  Localized ttp.    Cardiovascular: Normal rate and regular rhythm.   Pulmonary/Chest: Effort normal and breath sounds normal. No respiratory distress.  Musculoskeletal: Normal range of motion.  Lymphadenopathy:    He has no cervical adenopathy.  Neurological: He is alert and oriented to person, place, and time.  Skin: Skin is warm and dry. No rash noted.  Psychiatric: He has a normal mood and affect. His behavior is normal.    ED Course  Procedures (including critical care time) Labs Reviewed - No data to display No results found. 1. Folliculitis     MDM  Healthy 32yo M presents w/ non-traumatic, non-fluctuant, painful knot on right lateral neck.  Local inflammatory reaction to insect bite vs. Folliculitis.  Decided to treat w/ abx d/t patient's high level of concern as well as pain level.  He is allergic to penicillin; prescribed doxy.  Recommended warm compresses in case this is an early abscess. Return precautions discussed.   Otilio Miu, PA-C 01/13/13 [phone]

## 2013-01-13 NOTE — ED Notes (Signed)
Pt states that he had a "knot" come up on his neck yesterday. Pt states that yesterday it was the size of a nickel; pt states that it has gotten progressively worse today and making his neck sore

## 2013-01-13 NOTE — ED Provider Notes (Signed)
Medical screening examination/treatment/procedure(s) were performed by non-physician practitioner and as supervising physician I was immediately available for consultation/collaboration.  Gaylia Kassel M Krizia Flight, MD 01/13/13 0802 

## 2013-01-15 ENCOUNTER — Ambulatory Visit (INDEPENDENT_AMBULATORY_CARE_PROVIDER_SITE_OTHER): Payer: Managed Care, Other (non HMO) | Admitting: Family Medicine

## 2013-01-15 ENCOUNTER — Encounter: Payer: Self-pay | Admitting: Family Medicine

## 2013-01-15 VITALS — BP 110/72 | HR 57 | Temp 98.2°F | Ht 70.0 in | Wt 155.6 lb

## 2013-01-15 DIAGNOSIS — B354 Tinea corporis: Secondary | ICD-10-CM

## 2013-01-15 DIAGNOSIS — L0390 Cellulitis, unspecified: Secondary | ICD-10-CM

## 2013-01-15 DIAGNOSIS — L0291 Cutaneous abscess, unspecified: Secondary | ICD-10-CM

## 2013-01-15 MED ORDER — NAFTIFINE HCL 1 % EX CREA: TOPICAL_CREAM | Freq: Every day | CUTANEOUS | Status: DC

## 2013-01-15 NOTE — Assessment & Plan Note (Signed)
Cont doxy Refer to surgery--today---concerned about worsening pain

## 2013-01-15 NOTE — Progress Notes (Signed)
  Subjective:    Patient ID: William Oliver, male    DOB: [birth year], 32 y.o.   MRN: [id number]  HPI Pt here to establish --- and c/o cyst R side neck that has been there since sun.  He went to er Monday and was put on Doxy.  He states the reddness is better but pain goes into R ear and extends to collar bone.  No fevers.  Pt is still on doxycycline.    Review of Systems As above    Objective:   Physical Exam  BP 110/72  Pulse 57  Temp(Src) 98.2 F (36.8 C) (Oral)  Ht 5\' 10"  (1.778 m)  Wt 155 lb 9.6 oz (70.58 kg)  BMI 22.33 kg/m2  SpO2 97% General appearance: alert, cooperative, appears stated age and mild distress Ears: normal TM's and external ear canals both ears Neck: no adenopathy and + abscess R side with reddness extending down      Assessment & Plan:

## 2013-01-15 NOTE — Patient Instructions (Signed)
Abscess An abscess is an infected area that contains a collection of pus and debris.It can occur in almost any part of the body. An abscess is also known as a furuncle or boil. CAUSES  An abscess occurs when tissue gets infected. This can occur from blockage of oil or sweat glands, infection of hair follicles, or a minor injury to the skin. As the body tries to fight the infection, pus collects in the area and creates pressure under the skin. This pressure causes pain. People with weakened immune systems have difficulty fighting infections and get certain abscesses more often.  SYMPTOMS Usually an abscess develops on the skin and becomes a painful mass that is red, warm, and tender. If the abscess forms under the skin, you may feel a moveable soft area under the skin. Some abscesses break open (rupture) on their own, but most will continue to get worse without care. The infection can spread deeper into the body and eventually into the bloodstream, causing you to feel ill.  DIAGNOSIS  Your caregiver will take your medical history and perform a physical exam. A sample of fluid may also be taken from the abscess to determine what is causing your infection. TREATMENT  Your caregiver may prescribe antibiotic medicines to fight the infection. However, taking antibiotics alone usually does not cure an abscess. Your caregiver may need to make a small cut (incision) in the abscess to drain the pus. In some cases, gauze is packed into the abscess to reduce pain and to continue draining the area. HOME CARE INSTRUCTIONS   Only take over-the-counter or prescription medicines for pain, discomfort, or fever as directed by your caregiver.  If you were prescribed antibiotics, take them as directed. Finish them even if you start to feel better.  If gauze is used, follow your caregiver's directions for changing the gauze.  To avoid spreading the infection:  Keep your draining abscess covered with a  bandage.  Wash your hands well.  Do not share personal care items, towels, or whirlpools with others.  Avoid skin contact with others.  Keep your skin and clothes clean around the abscess.  Keep all follow-up appointments as directed by your caregiver. SEEK MEDICAL CARE IF:   You have increased pain, swelling, redness, fluid drainage, or bleeding.  You have muscle aches, chills, or a general ill feeling.  You have a fever. MAKE SURE YOU:   Understand these instructions.  Will watch your condition.  Will get help right away if you are not doing well or get worse. Document Released: 04/18/2005 Document Revised: 01/08/2012 Document Reviewed: 09/21/2011 [employer] Patient Information 2014 [employer].  

## 2013-12-04 ENCOUNTER — Telehealth: Payer: Self-pay

## 2013-12-04 NOTE — Telephone Encounter (Signed)
No answer.  Unable to leave voice message.  Voice mail box is not set up.

## 2013-12-07 ENCOUNTER — Ambulatory Visit: Payer: Managed Care, Other (non HMO) | Admitting: Family Medicine

## 2013-12-07 ENCOUNTER — Ambulatory Visit (INDEPENDENT_AMBULATORY_CARE_PROVIDER_SITE_OTHER): Payer: 59 | Admitting: Family Medicine

## 2013-12-07 ENCOUNTER — Encounter: Payer: Self-pay | Admitting: Family Medicine

## 2013-12-07 VITALS — BP 120/76 | HR 70 | Temp 97.8°F | Ht 70.0 in | Wt 156.0 lb

## 2013-12-07 DIAGNOSIS — G5600 Carpal tunnel syndrome, unspecified upper limb: Secondary | ICD-10-CM

## 2013-12-07 DIAGNOSIS — R269 Unspecified abnormalities of gait and mobility: Secondary | ICD-10-CM

## 2013-12-07 DIAGNOSIS — Z0000 Encounter for general adult medical examination without abnormal findings: Secondary | ICD-10-CM

## 2013-12-07 NOTE — Patient Instructions (Signed)
Preventive Care for Adults, Male A healthy lifestyle and preventive care can promote health and wellness. Preventive health guidelines for men include the following key practices:  A routine yearly physical is a good way to check with your health care provider about your health and preventative screening. It is a chance to share any concerns and updates on your health and to receive a thorough exam.  Visit your dentist for a routine exam and preventative care every 6 months. Brush your teeth twice a day and floss once a day. Good oral hygiene prevents tooth decay and gum disease.  The frequency of eye exams is based on your age, health, family medical history, use of contact lenses, and other factors. Follow your health care provider's recommendations for frequency of eye exams.  Eat a healthy diet. Foods such as vegetables, fruits, whole grains, low-fat dairy products, and lean protein foods contain the nutrients you need without too many calories. Decrease your intake of foods high in solid fats, added sugars, and salt. Eat the right amount of calories for you.Get information about a proper diet from your health care provider, if necessary.  Regular physical exercise is one of the most important things you can do for your health. Most adults should get at least 150 minutes of moderate-intensity exercise (any activity that increases your heart rate and causes you to sweat) each week. In addition, most adults need muscle-strengthening exercises on 2 or more days a week.  Maintain a healthy weight. The body mass index (BMI) is a screening tool to identify possible weight problems. It provides an estimate of body fat based on height and weight. Your health care provider can find your BMI and can help you achieve or maintain a healthy weight.For adults 20 years and older:  A BMI below 18.5 is considered underweight.  A BMI of 18.5 to 24.9 is normal.  A BMI of 25 to 29.9 is considered  overweight.  A BMI of 30 and above is considered obese.  Maintain normal blood lipids and cholesterol levels by exercising and minimizing your intake of saturated fat. Eat a balanced diet with plenty of fruit and vegetables. Blood tests for lipids and cholesterol should begin at age 42 and be repeated every 5 years. If your lipid or cholesterol levels are high, you are over 50, or you are at high risk for heart disease, you may need your cholesterol levels checked more frequently.Ongoing high lipid and cholesterol levels should be treated with medicines if diet and exercise are not working.  If you smoke, find out from your health care provider how to quit. If you do not use tobacco, do not start.  Lung cancer screening is recommended for adults aged 24 80 years who are at high risk for developing lung cancer because of a history of smoking. A yearly low-dose CT scan of the lungs is recommended for people who have at least a 30-pack-year history of smoking and are a current smoker or have quit within the past 15 years. A pack year of smoking is smoking an average of 1 pack of cigarettes a day for 1 year (for example: 1 pack a day for 30 years or 2 packs a day for 15 years). Yearly screening should continue until the smoker has stopped smoking for at least 15 years. Yearly screening should be stopped for people who develop a health problem that would prevent them from having lung cancer treatment.  If you choose to drink alcohol, do not have  more than 2 drinks per day. One drink is considered to be 12 ounces (355 mL) of beer, 5 ounces (148 mL) of wine, or 1.5 ounces (44 mL) of liquor.  Avoid use of street drugs. Do not share needles with anyone. Ask for help if you need support or instructions about stopping the use of drugs.  High blood pressure causes heart disease and increases the risk of stroke. Your blood pressure should be checked at least every 1 2 years. Ongoing high blood pressure should be  treated with medicines, if weight loss and exercise are not effective.  If you are 75 32 years old, ask your health care provider if you should take aspirin to prevent heart disease.  Diabetes screening involves taking a blood sample to check your fasting blood sugar level. This should be done once every 3 years, after age 19, if you are within normal weight and without risk factors for diabetes. Testing should be considered at a younger age or be carried out more frequently if you are overweight and have at least 1 risk factor for diabetes.  Colorectal cancer can be detected and often prevented. Most routine colorectal cancer screening begins at the age of 47 and continues through age 80. However, your health care provider may recommend screening at an earlier age if you have risk factors for colon cancer. On a yearly basis, your health care provider may provide home test kits to check for hidden blood in the stool. Use of a small camera at the end of a tube to directly examine the colon (sigmoidoscopy or colonoscopy) can detect the earliest forms of colorectal cancer. Talk to your health care provider about this at age 66, when routine screening begins. Direct exam of the colon should be repeated every 5 10 years through age 19, unless early forms of precancerous polyps or small growths are found.  People who are at an increased risk for hepatitis B should be screened for this virus. You are considered at high risk for hepatitis B if:  You were born in a country where hepatitis B occurs often. Talk with your health care provider about which countries are considered high-risk.  Your parents were born in a high-risk country and you have not received a shot to protect against hepatitis B (hepatitis B vaccine).  You have HIV or AIDS.  You use needles to inject street drugs.  You live with, or have sex with, someone who has hepatitis B.  You are a man who has sex with other men (MSM).  You get  hemodialysis treatment.  You take certain medicines for conditions such as cancer, organ transplantation, and autoimmune conditions.  Hepatitis C blood testing is recommended for all people born from [birth year] through 1965 and any individual with known risks for hepatitis C.  Practice safe sex. Use condoms and avoid high-risk sexual practices to reduce the spread of sexually transmitted infections (STIs). STIs include gonorrhea, chlamydia, syphilis, trichomonas, herpes, HPV, and human immunodeficiency virus (HIV). Herpes, HIV, and HPV are viral illnesses that have no cure. They can result in disability, cancer, and death.  A one-time screening for abdominal aortic aneurysm (AAA) and surgical repair of large AAAs by ultrasound are recommended for men ages 94 to 74 years who are current or former smokers.  Healthy men should no longer receive prostate-specific antigen (PSA) blood tests as part of routine cancer screening. Talk with your health care provider about prostate cancer screening.  Testicular cancer screening is not recommended  for adult males who have no symptoms. Screening includes self-exam, a health care provider exam, and other screening tests. Consult with your health care provider about any symptoms you have or any concerns you have about testicular cancer.  Use sunscreen. Apply sunscreen liberally and repeatedly throughout the day. You should seek shade when your shadow is shorter than you. Protect yourself by wearing long sleeves, pants, a wide-brimmed hat, and sunglasses year round, whenever you are outdoors.  Once a month, do a whole-body skin exam, using a mirror to look at the skin on your back. Tell your health care provider about new moles, moles that have irregular borders, moles that are larger than a pencil eraser, or moles that have changed in shape or color.  Stay current with required vaccines (immunizations).  Influenza vaccine. All adults should be immunized every  year.  Tetanus, diphtheria, and acellular pertussis (Td, Tdap) vaccine. An adult who has not previously received Tdap or who does not know his vaccine status should receive 1 dose of Tdap. This initial dose should be followed by tetanus and diphtheria toxoids (Td) booster doses every 10 years. Adults with an unknown or incomplete history of completing a 3-dose immunization series with Td-containing vaccines should begin or complete a primary immunization series including a Tdap dose. Adults should receive a Td booster every 10 years.  Varicella vaccine. An adult without evidence of immunity to varicella should receive 2 doses or a second dose if he has previously received 1 dose.  Human papillomavirus (HPV) vaccine. Males aged 44 21 years who have not received the vaccine previously should receive the 3-dose series. Males aged 43 26 years may be immunized. Immunization is recommended through the age of 50 years for any male who has sex with males and did not get any or all doses earlier. Immunization is recommended for any person with an immunocompromised condition through the age of 23 years if he did not get any or all doses earlier. During the 3-dose series, the second dose should be obtained 4 8 weeks after the first dose. The third dose should be obtained 24 weeks after the first dose and 16 weeks after the second dose.  Zoster vaccine. One dose is recommended for adults aged 96 years or older unless certain conditions are present.  Measles, mumps, and rubella (MMR) vaccine. Adults born before 55 generally are considered immune to measles and mumps. Adults born in [birth year] or later should have 1 or more doses of MMR vaccine unless there is a contraindication to the vaccine or there is laboratory evidence of immunity to each of the three diseases. A routine second dose of MMR vaccine should be obtained at least 28 days after the first dose for students attending postsecondary schools, health care  workers, or international travelers. People who received inactivated measles vaccine or an unknown type of measles vaccine during 1963 1967 should receive 2 doses of MMR vaccine. People who received inactivated mumps vaccine or an unknown type of mumps vaccine before 1979 and are at high risk for mumps infection should consider immunization with 2 doses of MMR vaccine. Unvaccinated health care workers born before [birth year] who lack laboratory evidence of measles, mumps, or rubella immunity or laboratory confirmation of disease should consider measles and mumps immunization with 2 doses of MMR vaccine or rubella immunization with 1 dose of MMR vaccine.  Pneumococcal 13-valent conjugate (PCV13) vaccine. When indicated, a person who is uncertain of his immunization history and has no record of immunization  should receive the PCV13 vaccine. An adult aged 67 years or older who has certain medical conditions and has not been previously immunized should receive 1 dose of PCV13 vaccine. This PCV13 should be followed with a dose of pneumococcal polysaccharide (PPSV23) vaccine. The PPSV23 vaccine dose should be obtained at least 8 weeks after the dose of PCV13 vaccine. An adult aged 79 years or older who has certain medical conditions and previously received 1 or more doses of PPSV23 vaccine should receive 1 dose of PCV13. The PCV13 vaccine dose should be obtained 1 or more years after the last PPSV23 vaccine dose.  Pneumococcal polysaccharide (PPSV23) vaccine. When PCV13 is also indicated, PCV13 should be obtained first. All adults aged 74 years and older should be immunized. An adult younger than age 50 years who has certain medical conditions should be immunized. Any person who resides in a nursing home or long-term care facility should be immunized. An adult smoker should be immunized. People with an immunocompromised condition and certain other conditions should receive both PCV13 and PPSV23 vaccines. People with human  immunodeficiency virus (HIV) infection should be immunized as soon as possible after diagnosis. Immunization during chemotherapy or radiation therapy should be avoided. Routine use of PPSV23 vaccine is not recommended for American Indians, [place] Natives, or people younger than 65 years unless there are medical conditions that require PPSV23 vaccine. When indicated, people who have unknown immunization and have no record of immunization should receive PPSV23 vaccine. One-time revaccination 5 years after the first dose of PPSV23 is recommended for people aged 41 64 years who have chronic kidney failure, nephrotic syndrome, asplenia, or immunocompromised conditions. People who received 1 2 doses of PPSV23 before age 15 years should receive another dose of PPSV23 vaccine at age 48 years or later if at least 5 years have passed since the previous dose. Doses of PPSV23 are not needed for people immunized with PPSV23 at or after age 69 years.  Meningococcal vaccine. Adults with asplenia or persistent complement component deficiencies should receive 2 doses of quadrivalent meningococcal conjugate (MenACWY-D) vaccine. The doses should be obtained at least 2 months apart. Microbiologists working with certain meningococcal bacteria, [place] recruits, people at risk during an outbreak, and people who travel to or live in countries with a high rate of meningitis should be immunized. A first-year college student up through age 7 years who is living in a residence hall should receive a dose if he did not receive a dose on or after his 16th birthday. Adults who have certain high-risk conditions should receive one or more doses of vaccine.  Hepatitis A vaccine. Adults who wish to be protected from this disease, have certain high-risk conditions, work with hepatitis A-infected animals, work in hepatitis A research labs, or travel to or work in countries with a high rate of hepatitis A should be immunized. Adults who were  previously unvaccinated and who anticipate close contact with an international adoptee during the first 60 days after arrival in the [place] from a country with a high rate of hepatitis A should be immunized.  Hepatitis B vaccine. Adults who wish to be protected from this disease, have certain high-risk conditions, may be exposed to blood or other infectious body fluids, are household contacts or sex partners of hepatitis B positive people, are clients or workers in certain care facilities, or travel to or work in countries with a high rate of hepatitis B should be immunized.  Haemophilus influenzae type b (Hib) vaccine. A  previously unvaccinated person with asplenia or sickle cell disease or having a scheduled splenectomy should receive 1 dose of Hib vaccine. Regardless of previous immunization, a recipient of a hematopoietic stem cell transplant should receive a 3-dose series 6 12 months after his successful transplant. Hib vaccine is not recommended for adults with HIV infection. Preventive Service / Frequency Ages 62 to 3  Blood pressure check.** / Every 1 to 2 years.  Lipid and cholesterol check.** / Every 5 years beginning at age 43.  Hepatitis C blood test.** / For any individual with known risks for hepatitis C.  Skin self-exam. / Monthly.  Influenza vaccine. / Every year.  Tetanus, diphtheria, and acellular pertussis (Tdap, Td) vaccine.** / Consult your health care provider. 1 dose of Td every 10 years.  Varicella vaccine.** / Consult your health care provider.  HPV vaccine. / 3 doses over 6 months, if 48 or younger.  Measles, mumps, rubella (MMR) vaccine.** / You need at least 1 dose of MMR if you were born in 1957 or later. You may also need a second dose.  Pneumococcal 13-valent conjugate (PCV13) vaccine.** / Consult your health care provider.  Pneumococcal polysaccharide (PPSV23) vaccine.** / 1 to 2 doses if you smoke cigarettes or if you have certain  conditions.  Meningococcal vaccine.** / 1 dose if you are age 8 to 70 years and a Market researcher living in a residence hall, or have one of several medical conditions. You may also need additional booster doses.  Hepatitis A vaccine.** / Consult your health care provider.  Hepatitis B vaccine.** / Consult your health care provider.  Haemophilus influenzae type b (Hib) vaccine.** / Consult your health care provider. Ages 48 to 32  Blood pressure check.** / Every 1 to 2 years.  Lipid and cholesterol check.** / Every 5 years beginning at age 38.  Lung cancer screening. / Every year if you are aged 40 80 years and have a 30-pack-year history of smoking and currently smoke or have quit within the past 15 years. Yearly screening is stopped once you have quit smoking for at least 15 years or develop a health problem that would prevent you from having lung cancer treatment.  Fecal occult blood test (FOBT) of stool. / Every year beginning at age 4 and continuing until age 70. You may not have to do this test if you get a colonoscopy every 10 years.  Flexible sigmoidoscopy** or colonoscopy.** / Every 5 years for a flexible sigmoidoscopy or every 10 years for a colonoscopy beginning at age 76 and continuing until age 62.  Hepatitis C blood test.** / For all people born from [birth year] through 1965 and any individual with known risks for hepatitis C.  Skin self-exam. / Monthly.  Influenza vaccine. / Every year.  Tetanus, diphtheria, and acellular pertussis (Tdap/Td) vaccine.** / Consult your health care provider. 1 dose of Td every 10 years.  Varicella vaccine.** / Consult your health care provider.  Zoster vaccine.** / 1 dose for adults aged 60 years or older.  Measles, mumps, rubella (MMR) vaccine.** / You need at least 1 dose of MMR if you were born in 1957 or later. You may also need a second dose.  Pneumococcal 13-valent conjugate (PCV13) vaccine.** / Consult your health care  provider.  Pneumococcal polysaccharide (PPSV23) vaccine.** / 1 to 2 doses if you smoke cigarettes or if you have certain conditions.  Meningococcal vaccine.** / Consult your health care provider.  Hepatitis A vaccine.** / Consult your health care  provider.  Hepatitis B vaccine.** / Consult your health care provider.  Haemophilus influenzae type b (Hib) vaccine.** / Consult your health care provider. Ages 65 and over  Blood pressure check.** / Every 1 to 2 years.  Lipid and cholesterol check.**/ Every 5 years beginning at age 20.  Lung cancer screening. / Every year if you are aged 55 80 years and have a 30-pack-year history of smoking and currently smoke or have quit within the past 15 years. Yearly screening is stopped once you have quit smoking for at least 15 years or develop a health problem that would prevent you from having lung cancer treatment.  Fecal occult blood test (FOBT) of stool. / Every year beginning at age 50 and continuing until age 75. You may not have to do this test if you get a colonoscopy every 10 years.  Flexible sigmoidoscopy** or colonoscopy.** / Every 5 years for a flexible sigmoidoscopy or every 10 years for a colonoscopy beginning at age 50 and continuing until age 75.  Hepatitis C blood test.** / For all people born from 1945 through 1965 and any individual with known risks for hepatitis C.  Abdominal aortic aneurysm (AAA) screening.** / A one-time screening for ages 65 to 75 years who are current or former smokers.  Skin self-exam. / Monthly.  Influenza vaccine. / Every year.  Tetanus, diphtheria, and acellular pertussis (Tdap/Td) vaccine.** / 1 dose of Td every 10 years.  Varicella vaccine.** / Consult your health care provider.  Zoster vaccine.** / 1 dose for adults aged 60 years or older.  Pneumococcal 13-valent conjugate (PCV13) vaccine.** / Consult your health care provider.  Pneumococcal polysaccharide (PPSV23) vaccine.** / 1 dose for all  adults aged 65 years and older.  Meningococcal vaccine.** / Consult your health care provider.  Hepatitis A vaccine.** / Consult your health care provider.  Hepatitis B vaccine.** / Consult your health care provider.  Haemophilus influenzae type b (Hib) vaccine.** / Consult your health care provider. **Family history and personal history of risk and conditions may change your health care provider's recommendations. Document Released: 09/04/2001 Document Revised: 04/29/2013 Document Reviewed: 12/04/2010 [employer] Patient Information 2014 [employer].  

## 2013-12-07 NOTE — Progress Notes (Signed)
Subjective:    Patient ID: William Oliver, male    DOB: 02/05/1981, 33 y.o.   MRN: [id number]  HPI Pt here for cpe and c/o cts both hands and wrists.  It is worsening and his hands are weak.  He is dropping things .     Review of Systems  Constitutional: Negative.   HENT: Negative for congestion, ear pain, hearing loss, nosebleeds, postnasal drip, rhinorrhea, sinus pressure, sneezing and tinnitus.   Eyes: Negative for photophobia, discharge, itching and visual disturbance.  Respiratory: Negative.   Cardiovascular: Negative.   Gastrointestinal: Negative for abdominal pain, constipation, blood in stool, abdominal distention and anal bleeding.  Endocrine: Negative.   Genitourinary: Negative.   Musculoskeletal: Negative.   Skin: Negative.   Allergic/Immunologic: Negative.   Neurological: Positive for numbness. Negative for dizziness, weakness, light-headedness and headaches.       Numbness in both hands and wrists-- -alternates between two-- wakes him up a night  Psychiatric/Behavioral: Negative for suicidal ideas, confusion, sleep disturbance, dysphoric mood, decreased concentration and agitation. The patient is not nervous/anxious.        Past Medical History  Diagnosis Date  . MRSA (methicillin resistant Staphylococcus aureus)    History   Social History  . Marital Status: Single    Spouse Name: N/A    Number of Children: 0  . Years of Education: N/A   Occupational History  . Welder/Machinist    Social History Main Topics  . Smoking status: Never Smoker   . Smokeless tobacco: Never Used  . Alcohol Use: No  . Drug Use: No  . Sexual Activity: Yes   Other Topics Concern  . Not on file   Social History Narrative  . No narrative on file   No current outpatient prescriptions on file.   No current facility-administered medications for this visit.   Family History  Problem Relation Age of Onset  . Lupus Mother   . COPD Father   . Heart disease Maternal Uncle   .  Throat cancer Father     never smoker  . Lung cancer Paternal Emelia LoronGrandfather     was a smoker  . Emphysema Maternal Grandmother   . Emphysema Paternal Grandfather   . Clotting disorder Maternal Grandmother     Objective:   Physical Exam  Nursing note reviewed. Constitutional: He is oriented to person, place, and time. He appears well-developed and well-nourished. No distress.  HENT:  Head: Normocephalic and atraumatic.  Right Ear: External ear normal.  Left Ear: External ear normal.  Nose: Nose normal.  Mouth/Throat: Oropharynx is clear and moist. No oropharyngeal exudate.  Eyes: Conjunctivae and EOM are normal. Pupils are equal, round, and reactive to light. Right eye exhibits no discharge. Left eye exhibits no discharge.  Neck: Normal range of motion. Neck supple. No JVD present. No thyromegaly present.  Cardiovascular: Normal rate, regular rhythm and intact distal pulses.  Exam reveals no gallop and no friction rub.   No murmur heard. Pulmonary/Chest: Effort normal and breath sounds normal. No respiratory distress. He has no wheezes. He has no rales. He exhibits no tenderness.  Abdominal: Soft. Bowel sounds are normal. He exhibits no distension and no mass. There is no tenderness. There is no rebound and no guarding. No hernia.  Genitourinary: Penis normal.  Musculoskeletal: Normal range of motion. He exhibits no edema and no tenderness.  Lymphadenopathy:    He has no cervical adenopathy.  Neurological: He is alert and oriented to person, place, and time.  He displays normal reflexes. He exhibits normal muscle tone.  Skin: Skin is warm and dry. No rash noted. He is not diaphoretic. No erythema. No pallor.  Psychiatric: He has a normal mood and affect. His behavior is normal. Judgment and thought content normal.          Assessment & Plan:  1. CTS (carpal tunnel syndrome) Wrist splints - Ambulatory referral to Hand Surgery  2. Preventative health care Labs done at work. Pt  will drop off results ghm utd  rto 1 year or sooner prn

## 2013-12-07 NOTE — Progress Notes (Signed)
Pre visit review using our clinic review tool, if applicable. No additional management support is needed unless otherwise documented below in the visit note. 

## 2013-12-11 NOTE — Telephone Encounter (Signed)
Was unable to reach patient pre visit.

## 2013-12-15 IMAGING — CR DG CHEST 2V
2 series · 2 of 2 positions shown · non-contrast
Comparison: None.

CLINICAL DATA: 31-year-old male dizziness, left arm numbness.

CHEST - 2 VIEW

[PA]
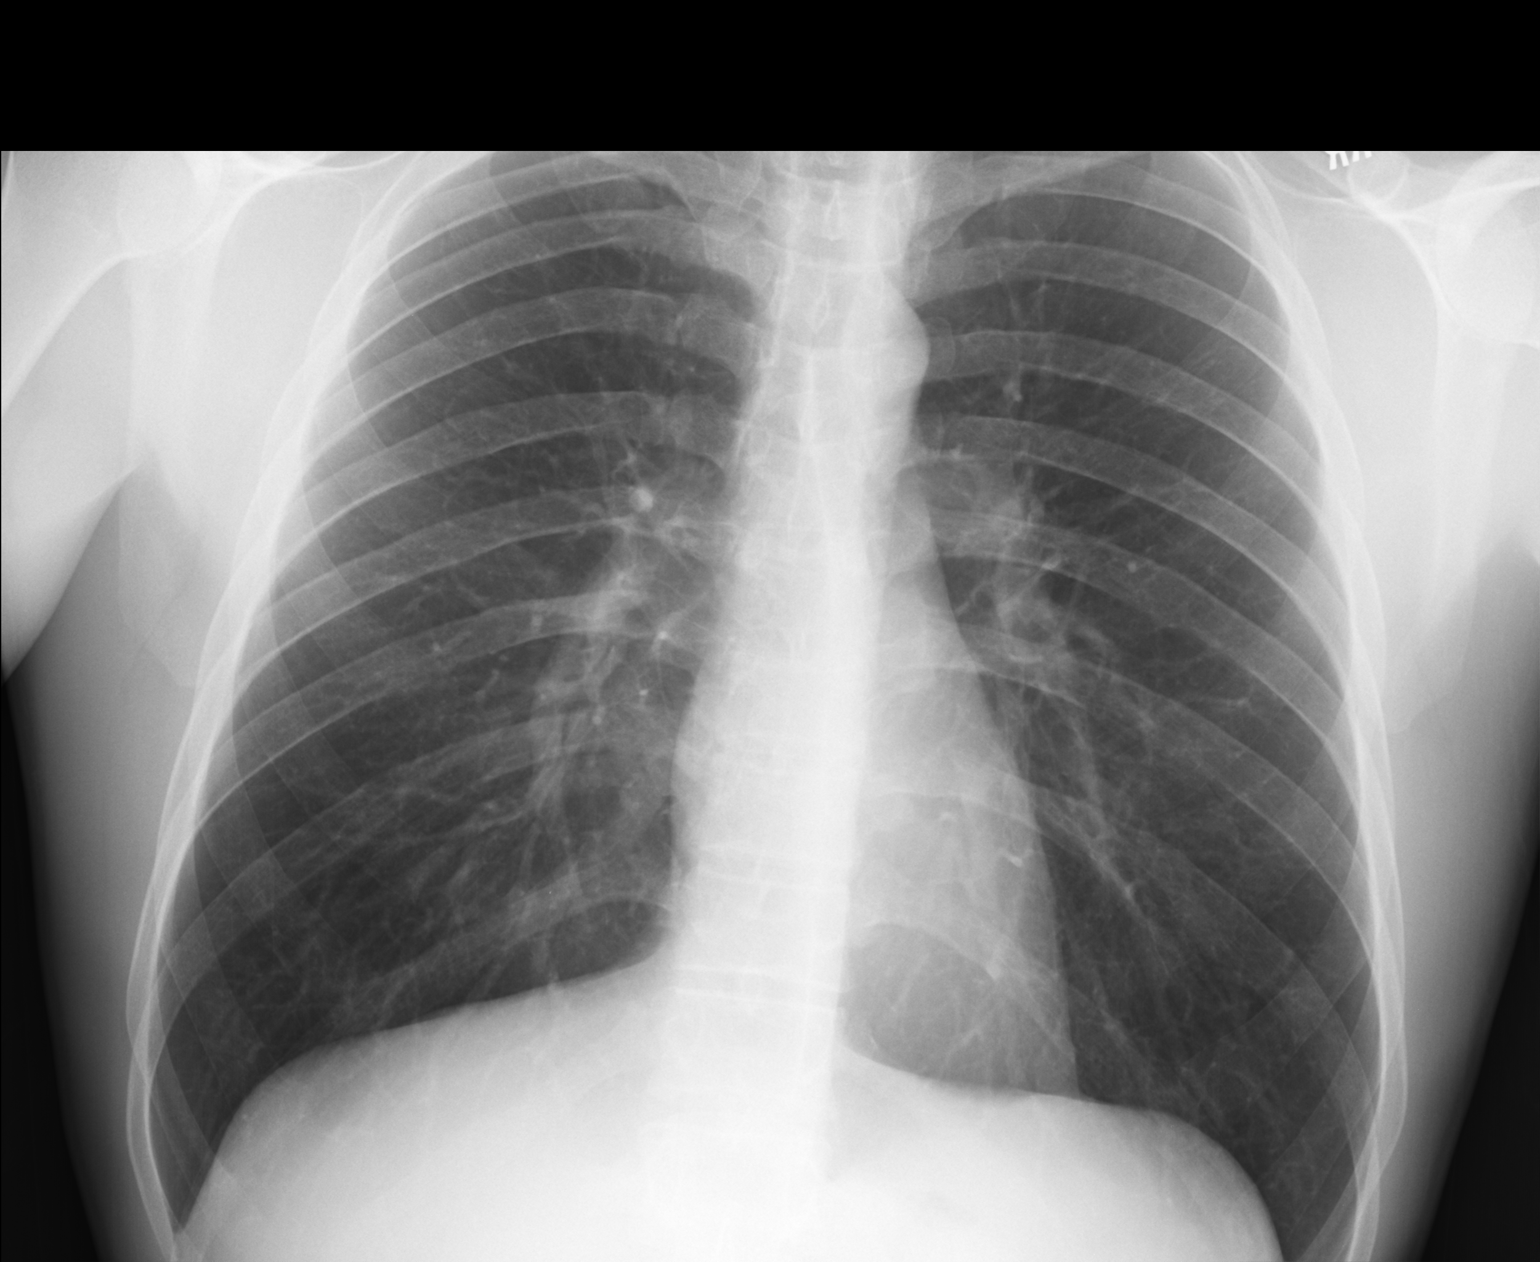

[lateral]
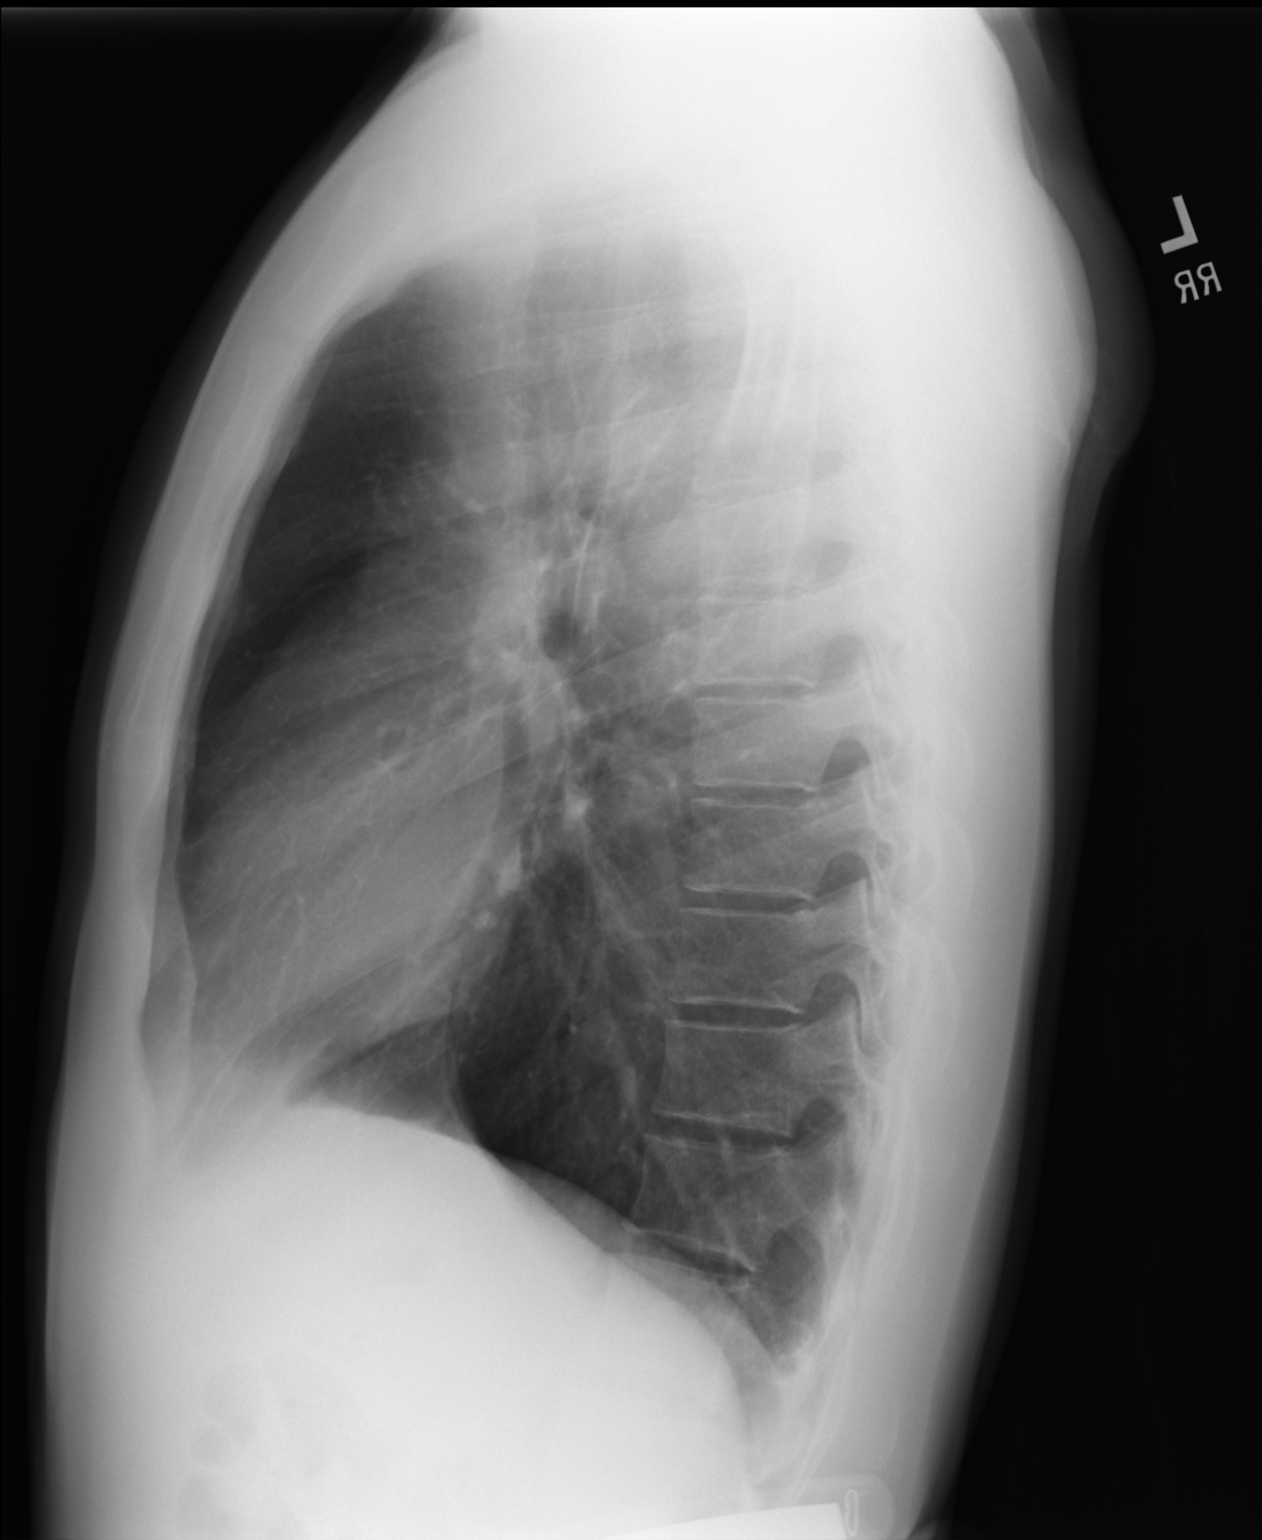

[2 of 2 positions shown; findings below may reference images not displayed]

FINDINGS: Normal cardiac size and mediastinal contours.  Visualized
tracheal air column is within normal limits.  Lung volumes likely
reflect good inspiratory effort.  No pneumothorax or pulmonary
edema.  No pleural effusion or consolidation.  No confluent
pulmonary opacity. No acute osseous abnormality identified.
IMPRESSION: Negative, no acute cardiopulmonary abnormality.

## 2014-01-04 ENCOUNTER — Ambulatory Visit (HOSPITAL_BASED_OUTPATIENT_CLINIC_OR_DEPARTMENT_OTHER)
Admission: RE | Admit: 2014-01-04 | Discharge: 2014-01-04 | Disposition: A | Discharge status: Home / Self Care | Payer: 59 | Source: Ambulatory Visit | Attending: Orthopedic Surgery | Admitting: Orthopedic Surgery

## 2014-01-04 ENCOUNTER — Encounter (HOSPITAL_BASED_OUTPATIENT_CLINIC_OR_DEPARTMENT_OTHER): Payer: 59 | Admitting: Anesthesiology

## 2014-01-04 ENCOUNTER — Ambulatory Visit (INDEPENDENT_AMBULATORY_CARE_PROVIDER_SITE_OTHER): Payer: 59 | Admitting: Family Medicine

## 2014-01-04 ENCOUNTER — Encounter (HOSPITAL_BASED_OUTPATIENT_CLINIC_OR_DEPARTMENT_OTHER)
Admission: RE | Disposition: A | Discharge status: Home / Self Care | Payer: Self-pay | Source: Ambulatory Visit | Attending: Orthopedic Surgery

## 2014-01-04 ENCOUNTER — Encounter (HOSPITAL_BASED_OUTPATIENT_CLINIC_OR_DEPARTMENT_OTHER): Payer: Self-pay | Admitting: *Deleted

## 2014-01-04 ENCOUNTER — Ambulatory Visit (HOSPITAL_BASED_OUTPATIENT_CLINIC_OR_DEPARTMENT_OTHER): Payer: 59 | Admitting: Anesthesiology

## 2014-01-04 ENCOUNTER — Other Ambulatory Visit: Payer: Self-pay | Admitting: Orthopedic Surgery

## 2014-01-04 VITALS — BP 120/72 | HR 61 | Temp 97.8°F | Resp 18 | Ht 71.0 in | Wt 154.8 lb

## 2014-01-04 DIAGNOSIS — X58XXXA Exposure to other specified factors, initial encounter: Secondary | ICD-10-CM | POA: Insufficient documentation

## 2014-01-04 DIAGNOSIS — J45909 Unspecified asthma, uncomplicated: Secondary | ICD-10-CM | POA: Insufficient documentation

## 2014-01-04 DIAGNOSIS — Z8614 Personal history of Methicillin resistant Staphylococcus aureus infection: Secondary | ICD-10-CM | POA: Insufficient documentation

## 2014-01-04 DIAGNOSIS — S56429A Laceration of extensor muscle, fascia and tendon of unspecified finger at forearm level, initial encounter: Secondary | ICD-10-CM

## 2014-01-04 DIAGNOSIS — S61209A Unspecified open wound of unspecified finger without damage to nail, initial encounter: Secondary | ICD-10-CM | POA: Insufficient documentation

## 2014-01-04 HISTORY — DX: Reserved for concepts with insufficient information to code with codable children: IMO0002

## 2014-01-04 HISTORY — PX: TENDON REPAIR: SHX5111

## 2014-01-04 LAB — POCT HEMOGLOBIN-HEMACUE: Hemoglobin: 17.1 g/dL — ABNORMAL HIGH (ref 13.0–17.0)

## 2014-01-04 SURGERY — TENDON REPAIR
Anesthesia: General | Site: Finger | Laterality: Left

## 2014-01-04 MED ORDER — PROPOFOL 10 MG/ML IV BOLUS
INTRAVENOUS | Status: DC | PRN
  Administered 2014-01-04: 150 mg via INTRAVENOUS

## 2014-01-04 MED ORDER — MIDAZOLAM HCL 2 MG/ML PO SYRP: 0.5000 mg/kg | ORAL_SOLUTION | Freq: Once | ORAL | Status: DC | PRN

## 2014-01-04 MED ORDER — FENTANYL CITRATE 0.05 MG/ML IJ SOLN
INTRAMUSCULAR | Status: AC
  Filled 2014-01-04: qty 6

## 2014-01-04 MED ORDER — MEPERIDINE HCL 25 MG/ML IJ SOLN: 6.2500 mg | INTRAMUSCULAR | Status: DC | PRN

## 2014-01-04 MED ORDER — MIDAZOLAM HCL 2 MG/2ML IJ SOLN
INTRAMUSCULAR | Status: AC
  Filled 2014-01-04: qty 2

## 2014-01-04 MED ORDER — HYDROMORPHONE HCL PF 1 MG/ML IJ SOLN
0.2500 mg | INTRAMUSCULAR | Status: DC | PRN
  Administered 2014-01-04: 0.5 mg via INTRAVENOUS

## 2014-01-04 MED ORDER — LIDOCAINE HCL (CARDIAC) 20 MG/ML IV SOLN
INTRAVENOUS | Status: DC | PRN
  Administered 2014-01-04: 100 mg via INTRAVENOUS

## 2014-01-04 MED ORDER — OXYCODONE HCL 5 MG/5ML PO SOLN: 5.0000 mg | Freq: Once | ORAL | Status: DC | PRN

## 2014-01-04 MED ORDER — OXYCODONE-ACETAMINOPHEN 5-325 MG PO TABS: 1.0000 | ORAL_TABLET | ORAL | Status: DC | PRN

## 2014-01-04 MED ORDER — PROMETHAZINE HCL 25 MG/ML IJ SOLN
6.2500 mg | INTRAMUSCULAR | Status: DC | PRN
Start: 2014-01-04 — End: 2014-01-04
  Administered 2014-01-04: 6.25 mg via INTRAVENOUS

## 2014-01-04 MED ORDER — PROPOFOL 10 MG/ML IV BOLUS
INTRAVENOUS | Status: AC
  Filled 2014-01-04: qty 20

## 2014-01-04 MED ORDER — PROMETHAZINE HCL 25 MG/ML IJ SOLN
INTRAMUSCULAR | Status: AC
  Filled 2014-01-04: qty 1

## 2014-01-04 MED ORDER — HYDROMORPHONE HCL PF 1 MG/ML IJ SOLN
INTRAMUSCULAR | Status: AC
  Filled 2014-01-04: qty 1

## 2014-01-04 MED ORDER — FENTANYL CITRATE 0.05 MG/ML IJ SOLN
INTRAMUSCULAR | Status: DC | PRN
  Administered 2014-01-04: 100 ug via INTRAVENOUS

## 2014-01-04 MED ORDER — ONDANSETRON HCL 4 MG/2ML IJ SOLN
INTRAMUSCULAR | Status: DC | PRN
  Administered 2014-01-04: 4 mg via INTRAVENOUS

## 2014-01-04 MED ORDER — MIDAZOLAM HCL 2 MG/2ML IJ SOLN: 1.0000 mg | INTRAMUSCULAR | Status: DC | PRN

## 2014-01-04 MED ORDER — MIDAZOLAM HCL 5 MG/5ML IJ SOLN
INTRAMUSCULAR | Status: DC | PRN
  Administered 2014-01-04: 2 mg via INTRAVENOUS

## 2014-01-04 MED ORDER — LACTATED RINGERS IV SOLN
INTRAVENOUS | Status: DC
  Administered 2014-01-04 (×2): via INTRAVENOUS

## 2014-01-04 MED ORDER — CHLORHEXIDINE GLUCONATE 4 % EX LIQD: 60.0000 mL | Freq: Once | CUTANEOUS | Status: DC

## 2014-01-04 MED ORDER — BUPIVACAINE HCL (PF) 0.25 % IJ SOLN
INTRAMUSCULAR | Status: DC | PRN
  Administered 2014-01-04: 7 mL

## 2014-01-04 MED ORDER — VANCOMYCIN HCL IN DEXTROSE 1-5 GM/200ML-% IV SOLN: 1000.0000 mg | INTRAVENOUS | Status: DC

## 2014-01-04 MED ORDER — ONDANSETRON HCL 4 MG/2ML IJ SOLN
INTRAMUSCULAR | Status: AC
Start: 2014-01-04 — End: 2014-01-04
  Filled 2014-01-04: qty 2

## 2014-01-04 MED ORDER — DEXAMETHASONE SODIUM PHOSPHATE 4 MG/ML IJ SOLN
INTRAMUSCULAR | Status: DC | PRN
  Administered 2014-01-04: 10 mg via INTRAVENOUS

## 2014-01-04 MED ORDER — BUPIVACAINE HCL (PF) 0.25 % IJ SOLN
INTRAMUSCULAR | Status: AC
  Filled 2014-01-04: qty 30

## 2014-01-04 MED ORDER — FENTANYL CITRATE 0.05 MG/ML IJ SOLN: 50.0000 ug | INTRAMUSCULAR | Status: DC | PRN

## 2014-01-04 MED ORDER — OXYCODONE HCL 5 MG PO TABS: 5.0000 mg | ORAL_TABLET | Freq: Once | ORAL | Status: DC | PRN

## 2014-01-04 MED ORDER — SUCCINYLCHOLINE CHLORIDE 20 MG/ML IJ SOLN
INTRAMUSCULAR | Status: DC | PRN
  Administered 2014-01-04: 100 mg via INTRAVENOUS

## 2014-01-04 MED ORDER — CEFAZOLIN SODIUM-DEXTROSE 2-3 GM-% IV SOLR
INTRAVENOUS | Status: DC | PRN
  Administered 2014-01-04: 2 g via INTRAVENOUS

## 2014-01-04 MED ORDER — CEFAZOLIN SODIUM-DEXTROSE 2-3 GM-% IV SOLR
INTRAVENOUS | Status: AC
  Filled 2014-01-04: qty 50

## 2014-01-04 MED ORDER — ONDANSETRON HCL 4 MG/2ML IJ SOLN
4.0000 mg | Freq: Once | INTRAMUSCULAR | Status: AC | PRN
  Administered 2014-01-04: 4 mg via INTRAVENOUS

## 2014-01-04 SURGICAL SUPPLY — 57 items
APL SKNCLS STERI-STRIP NONHPOA (GAUZE/BANDAGES/DRESSINGS)
BANDAGE ELASTIC 4 VELCRO ST LF (GAUZE/BANDAGES/DRESSINGS) IMPLANT
BENZOIN TINCTURE PRP APPL 2/3 (GAUZE/BANDAGES/DRESSINGS) IMPLANT
BLADE SURG 15 STRL LF DISP TIS (BLADE) ×1 IMPLANT
BLADE SURG 15 STRL SS (BLADE) ×2
BNDG CMPR 9X4 STRL LF SNTH (GAUZE/BANDAGES/DRESSINGS)
BNDG CMPR MD 5X2 ELC HKLP STRL (GAUZE/BANDAGES/DRESSINGS)
BNDG ELASTIC 2 VLCR STRL LF (GAUZE/BANDAGES/DRESSINGS) IMPLANT
BNDG ESMARK 4X9 LF (GAUZE/BANDAGES/DRESSINGS) IMPLANT
BNDG GAUZE ELAST 4 BULKY (GAUZE/BANDAGES/DRESSINGS) ×2 IMPLANT
BRUSH SCRUB EZ PLAIN DRY (MISCELLANEOUS) IMPLANT
CORDS BIPOLAR (ELECTRODE) ×2 IMPLANT
COVER TABLE BACK 60X90 (DRAPES) ×2 IMPLANT
CUFF TOURNIQUET SINGLE 18IN (TOURNIQUET CUFF) ×2 IMPLANT
DECANTER SPIKE VIAL GLASS SM (MISCELLANEOUS) IMPLANT
DRAPE EXTREMITY T 121X128X90 (DRAPE) ×2 IMPLANT
DRAPE SURG 17X23 STRL (DRAPES) ×2 IMPLANT
DURAPREP 26ML APPLICATOR (WOUND CARE) ×2 IMPLANT
GAUZE SPONGE 4X4 12PLY STRL (GAUZE/BANDAGES/DRESSINGS) ×2 IMPLANT
GAUZE XEROFORM 1X8 LF (GAUZE/BANDAGES/DRESSINGS) IMPLANT
GLOVE BIO SURGEON STRL SZ7 (GLOVE) ×2 IMPLANT
GLOVE BIOGEL PI IND STRL 7.0 (GLOVE) ×1 IMPLANT
GLOVE BIOGEL PI INDICATOR 7.0 (GLOVE) ×1
GLOVE SURG SYN 8.0 (GLOVE) ×4 IMPLANT
GOWN STRL REUS W/ TWL LRG LVL3 (GOWN DISPOSABLE) ×1 IMPLANT
GOWN STRL REUS W/TWL LRG LVL3 (GOWN DISPOSABLE) ×2
GOWN STRL REUS W/TWL XL LVL3 (GOWN DISPOSABLE) ×2 IMPLANT
NEEDLE HYPO 25X1 1.5 SAFETY (NEEDLE) ×2 IMPLANT
NS IRRIG 1000ML POUR BTL (IV SOLUTION) ×2 IMPLANT
PACK BASIN DAY SURGERY FS (CUSTOM PROCEDURE TRAY) ×2 IMPLANT
PAD CAST 4YDX4 CTTN HI CHSV (CAST SUPPLIES) ×1 IMPLANT
PADDING CAST ABS 4INX4YD NS (CAST SUPPLIES) ×1
PADDING CAST ABS COTTON 4X4 ST (CAST SUPPLIES) ×1 IMPLANT
PADDING CAST COTTON 4X4 STRL (CAST SUPPLIES) ×2
PADDING UNDERCAST 2 STRL (CAST SUPPLIES)
PADDING UNDERCAST 2X4 STRL (CAST SUPPLIES) IMPLANT
SHEET MEDIUM DRAPE 40X70 STRL (DRAPES) ×2 IMPLANT
SLEEVE SCD COMPRESS KNEE MED (MISCELLANEOUS) IMPLANT
STOCKINETTE 4X48 STRL (DRAPES) ×2 IMPLANT
STRIP CLOSURE SKIN 1/2X4 (GAUZE/BANDAGES/DRESSINGS) IMPLANT
SUT ETHIBOND 3-0 V-5 (SUTURE) IMPLANT
SUT ETHILON 4 0 PS 2 18 (SUTURE) ×2 IMPLANT
SUT ETHILON 5 0 PS 2 18 (SUTURE) IMPLANT
SUT FIBERWIRE 3-0 18 TAPR NDL (SUTURE) ×2
SUT PROLENE 3 0 PS 2 (SUTURE) IMPLANT
SUT PROLENE 6 0 PC 1 (SUTURE) ×2 IMPLANT
SUT SILK 4 0 PS 2 (SUTURE) IMPLANT
SUT VIC AB 4-0 P-3 18XBRD (SUTURE) IMPLANT
SUT VIC AB 4-0 P3 18 (SUTURE)
SUT VICRYL RAPIDE 4-0 (SUTURE) IMPLANT
SUT VICRYL RAPIDE 4/0 PS 2 (SUTURE) IMPLANT
SUTURE FIBERWR 3-0 18 TAPR NDL (SUTURE) ×1 IMPLANT
SYR BULB 3OZ (MISCELLANEOUS) ×2 IMPLANT
SYRINGE 10CC LL (SYRINGE) ×2 IMPLANT
TOWEL OR 17X24 6PK STRL BLUE (TOWEL DISPOSABLE) ×2 IMPLANT
TRAY DSU PREP LF (CUSTOM PROCEDURE TRAY) IMPLANT
UNDERPAD 30X30 INCONTINENT (UNDERPADS AND DIAPERS) ×2 IMPLANT

## 2014-01-04 NOTE — Transfer of Care (Signed)
Immediate Anesthesia Transfer of Care Note  Patient: William Oliver  Procedure(s) Performed: Procedure(s): TENDON REPAIR (Left)  Patient Location: PACU  Anesthesia Type:General  Level of Consciousness: awake, sedated and patient cooperative  Airway & Oxygen Therapy: Patient Spontanous Breathing and Patient connected to face mask oxygen  Post-op Assessment: Report given to PACU RN and Post -op Vital signs reviewed and stable  Post vital signs: Reviewed and stable  Complications: No apparent anesthesia complications

## 2014-01-04 NOTE — Op Note (Signed)
See [id number]

## 2014-01-04 NOTE — Anesthesia Procedure Notes (Addendum)
Performed by: Gar GibbonKEETON, Siham Bucaro S   Procedure Name: Intubation Date/Time: 01/04/2014 2:24 PM Performed by: Gar GibbonKEETON, Demarko Zeimet S Pre-anesthesia Checklist: Patient identified, Emergency Drugs available, Suction available and Patient being monitored Patient Re-evaluated:Patient Re-evaluated prior to inductionOxygen Delivery Method: Circle System Utilized Preoxygenation: Pre-oxygenation with 100% oxygen Intubation Type: IV induction Ventilation: Mask ventilation without difficulty Laryngoscope Size: Mac and 4 Grade View: Grade I Tube type: Oral Tube size: 8.0 mm Number of attempts: 1 Airway Equipment and Method: stylet and oral airway Placement Confirmation: ETT inserted through vocal cords under direct vision,  positive ETCO2 and breath sounds checked- equal and bilateral Secured at: 23 cm Tube secured with: Tape Dental Injury: Teeth and Oropharynx as per pre-operative assessment

## 2014-01-04 NOTE — Discharge Instructions (Signed)
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
## 2014-01-04 NOTE — Anesthesia Preprocedure Evaluation (Signed)
Anesthesia Evaluation  Patient identified by MRN, date of birth, ID band Patient awake    Reviewed: Allergy & Precautions, H&P , NPO status , Patient's Chart, lab work & pertinent test results  Airway Mallampati: I TM Distance: >3 FB Neck ROM: Full    Dental   Pulmonary asthma ,          Cardiovascular     Neuro/Psych    GI/Hepatic   Endo/Other    Renal/GU      Musculoskeletal   Abdominal   Peds  Hematology   Anesthesia Other Findings   Reproductive/Obstetrics                           Anesthesia Physical Anesthesia Plan  ASA: I  Anesthesia Plan: General   Post-op Pain Management:    Induction: Intravenous  Airway Management Planned: Oral ETT  Additional Equipment:   Intra-op Plan:   Post-operative Plan: Extubation in OR  Informed Consent: I have reviewed the patients History and Physical, chart, labs and discussed the procedure including the risks, benefits and alternatives for the proposed anesthesia with the patient or authorized representative who has indicated his/her understanding and acceptance.     Plan Discussed with: CRNA and Surgeon  Anesthesia Plan Comments:         Anesthesia Quick Evaluation

## 2014-01-04 NOTE — H&P (Signed)
William Oliver is an 33 y.o. male.   Chief Complaint: loss of left long extension HPI: as above s/p dorsal laceration 24 hours ago  Past Medical History  Diagnosis Date  . MRSA (methicillin resistant Staphylococcus aureus)   . Laceration     left middle finger    Past Surgical History  Procedure Laterality Date  . Hand surgery    . Wisdom tooth extraction      Family History  Problem Relation Age of Onset  . Lupus Mother   . COPD Father   . Heart disease Maternal Uncle   . Throat cancer Father     never smoker  . Lung cancer Paternal Emelia LoronGrandfather     was a smoker  . Emphysema Maternal Grandmother   . Emphysema Paternal Grandfather   . Clotting disorder Maternal Grandmother    Social History:  reports that he has never smoked. He has never used smokeless tobacco. He reports that he does not drink alcohol or use illicit drugs.  Allergies:  Allergies  Allergen Reactions  . Penicillins Hives and Swelling    No prescriptions prior to admission    No results found for this or any previous visit (from the past 48 hour(s)). No results found.  Review of Systems  All other systems reviewed and are negative.   Blood pressure 113/72, pulse 59, temperature 97.9 F (36.6 C), temperature source Oral, resp. rate 16, weight 68.493 kg (151 lb), SpO2 98.00%. Physical Exam  Constitutional: He is oriented to person, place, and time. He appears well-developed and well-nourished.  HENT:  Head: Normocephalic and atraumatic.  Cardiovascular: Normal rate.   Respiratory: Effort normal.  Musculoskeletal:       Left hand: He exhibits deformity and laceration.  Left long finger dorsal MCPJ laceration with loss of active extension  Neurological: He is alert and oriented to person, place, and time.  Skin: Skin is warm.  Psychiatric: He has a normal mood and affect. His behavior is normal. Judgment and thought content normal.     Assessment/Plan As above   Plan explore and repair as  needed  Ladeja Pelham A 01/04/2014, 2:03 PM

## 2014-01-04 NOTE — Anesthesia Postprocedure Evaluation (Signed)
Anesthesia Post Note  Patient: William Oliver  Procedure(s) Performed: Procedure(s) (LRB): TENDON REPAIR (Left)  Anesthesia type: general  Patient location: PACU  Post pain: Pain level controlled  Post assessment: Patient's Cardiovascular Status Stable  Last Vitals:  Filed Vitals:   01/04/14 1630  BP: 123/67  Pulse: 56  Temp:   Resp: 12    Post vital signs: Reviewed and stable  Level of consciousness: sedated  Complications: No apparent anesthesia complications

## 2014-01-05 ENCOUNTER — Encounter (HOSPITAL_BASED_OUTPATIENT_CLINIC_OR_DEPARTMENT_OTHER): Payer: Self-pay | Admitting: Orthopedic Surgery

## 2014-01-05 NOTE — Op Note (Signed)
NAMAtlee Abide:  William Oliver, William Oliver                  ACCOUNT NO.:  [id number]  MEDICAL RECORD NO.:  [id number]  LOCATION:                                 FACILITY:  PHYSICIAN:  Artist PaisMatthew A. Weingold, M.D.DATE OF BIRTH:  [birth year]  DATE OF PROCEDURE:  01/04/2014 DATE OF DISCHARGE:  01/04/2014                              OPERATIVE REPORT   PREOPERATIVE DIAGNOSIS:  Left long finger dorsal laceration over the metacarpophalangeal joint with loss of extension.  POSTOPERATIVE DIAGNOSIS:  Left long finger dorsal laceration over the metacarpophalangeal joint with loss of extension.  PROCEDURE:  Exploration and primary repair of left long finger extensor tendon.  SURGEON:  Artist PaisMatthew A. Mina MarbleWeingold, M.D.  ASSISTANT:  None.  ANESTHESIA:  General.  COMPLICATIONS:  None.  DRAINS:  None.  DESCRIPTION OF PROCEDURE:  The patient was taken to the operating suite. After the induction of adequate general anesthesia, left upper extremity was prepped and draped in the usual sterile fashion.  An Esmarch was used to exsanguinate the limb.  Tourniquet was inflated to 250 mmHg.  At this point in time, the patient's left upper extremity was prepped and draped in usual sterile fashion.  Esmarch was used to exsanguinate the limb.  Tourniquet was inflated to 250 mmHg.  At this point in time, a longitudinal incision was made, extending the previous laceration proximally and distally gently and exposing the extensor mechanism of the long finger on the left hand.  There was complete laceration extensor mechanism just distal to the metacarpophalangeal joint.  The wound was irrigated.  The primary repair was undertaken with 3-0 FiberWire in three horizontal mattress sutures followed by 6-0 Prolene epitendinous lock stitch dorsally.  The wound was then loosely closed with 4-0 nylon.  Xeroform, 4x4s, and a volar splint was applied.  The patient tolerated the procedure well and went to the recovery room in stable  fashion.    Artist PaisMatthew A. Mina MarbleWeingold, M.D.    MAW/MEDQ  D:  01/04/2014  T:  01/05/2014  Job:  [id number]

## 2014-04-20 ENCOUNTER — Ambulatory Visit (INDEPENDENT_AMBULATORY_CARE_PROVIDER_SITE_OTHER): Payer: 59 | Admitting: Physician Assistant

## 2014-04-20 ENCOUNTER — Encounter: Payer: Self-pay | Admitting: Physician Assistant

## 2014-04-20 VITALS — BP 142/90 | HR 63 | Temp 98.1°F | Resp 16 | Ht 71.0 in | Wt 158.1 lb

## 2014-04-20 DIAGNOSIS — R519 Headache, unspecified: Secondary | ICD-10-CM

## 2014-04-20 DIAGNOSIS — M2560 Stiffness of unspecified joint, not elsewhere classified: Secondary | ICD-10-CM

## 2014-04-20 DIAGNOSIS — R079 Chest pain, unspecified: Secondary | ICD-10-CM

## 2014-04-20 DIAGNOSIS — R51 Headache: Secondary | ICD-10-CM

## 2014-04-20 DIAGNOSIS — J45909 Unspecified asthma, uncomplicated: Secondary | ICD-10-CM

## 2014-04-20 DIAGNOSIS — J4520 Mild intermittent asthma, uncomplicated: Secondary | ICD-10-CM

## 2014-04-20 DIAGNOSIS — R0789 Other chest pain: Secondary | ICD-10-CM

## 2014-04-20 DIAGNOSIS — M722 Plantar fascial fibromatosis: Secondary | ICD-10-CM

## 2014-04-20 LAB — RHEUMATOID FACTOR

## 2014-04-20 MED ORDER — ALBUTEROL SULFATE HFA 108 (90 BASE) MCG/ACT IN AERS: 2.0000 | INHALATION_SPRAY | Freq: Four times a day (QID) | RESPIRATORY_TRACT | Status: DC | PRN

## 2014-04-20 MED ORDER — HYDROCORTISONE ACETATE 25 MG RE SUPP: 25.0000 mg | Freq: Two times a day (BID) | RECTAL | Status: DC

## 2014-04-20 MED ORDER — SUMATRIPTAN SUCCINATE 50 MG PO TABS: 50.0000 mg | ORAL_TABLET | Freq: Once | ORAL | Status: DC

## 2014-04-20 NOTE — Patient Instructions (Signed)
For headaches, please stay well hydrated and eat something every 3-4 hours throughout the day.  A little caffeine may be beneficial.  Take an Excedrin migraine for headache symptoms. I do think symptoms of headache and tingling of fingers are stemming from stress and anxiety.  Please try to relax and remove yourself from very stressful situations.  For chronic chest tightness, it seems this may be a combination of anxiety and reactive airway disease.  Use Albuterol inhaler as directed for chest tightness.  Take a daily claritin for allergies.  I am setting you up with a new Pulmonologist for lung function testing.   For heel pain -- make sure to wear shoes with a high arch support, to take pressure and inflammation off of the heels.  Roll a cold soda can under your arches and heels to help calm down inflammation.  Tylenol may be beneficial.  Please follow-up with Dr. Laury AxonLowne in 2-3 weeks.  If you develop severe shortness of breath or chest pain at any time, please proceed to an ER.

## 2014-04-20 NOTE — Progress Notes (Signed)
Pre visit review using our clinic review tool, if applicable. No additional management support is needed unless otherwise documented below in the visit note/SLS  

## 2014-04-20 NOTE — Progress Notes (Signed)
Patient presents to clinic today with multiple complaints.   Patient complains of headaches with photophobia, phonophobia and nausea intermittently over past 4 months, but more frequent over the past 2 weeks.  Denies blurry vision, confusion, facial weakness or numbness.  Endorses some increase in stress.  Denies change to diet.  Endorses good fluid intake.  Patient also complains of chest tightness not associated with SOB or overt chest pain.  States the tightness sometimes radiates to the left arm.  Denies hx of HTN or CAD. Denies anxiety although notes increased stressors after learning his mother was diagnosed with cancer.  Patient complains of heel pain bilaterally first noticed 1 month ago. Pain is worst in the morning or when standing after prolonged sitting.    Past Medical History  Diagnosis Date  . MRSA (methicillin resistant Staphylococcus aureus)   . Laceration     left middle finger    No current outpatient prescriptions on file prior to visit.   No current facility-administered medications on file prior to visit.    Allergies  Allergen Reactions  . Penicillins Hives and Swelling    Family History  Problem Relation Age of Onset  . Lupus Mother   . COPD Father   . Heart disease Maternal Uncle   . Throat cancer Father     never smoker  . Lung cancer Paternal Emelia LoronGrandfather     was a smoker  . Emphysema Maternal Grandmother   . Emphysema Paternal Grandfather   . Clotting disorder Maternal Grandmother     History   Social History  . Marital Status: Single    Spouse Name: N/A    Number of Children: 0  . Years of Education: N/A   Occupational History  . Welder/Machinist    Social History Main Topics  . Smoking status: Never Smoker   . Smokeless tobacco: Never Used  . Alcohol Use: No  . Drug Use: No  . Sexual Activity: Yes   Other Topics Concern  . None   Social History Narrative  . None   Review of Systems - See HPI.  All other ROS are  negative.  BP 142/90  Pulse 63  Temp(Src) 98.1 F (36.7 C) (Oral)  Resp 16  Ht 5\' 11"  (1.803 m)  Wt 158 lb 2 oz (71.725 kg)  BMI 22.06 kg/m2  SpO2 100%  Physical Exam  Vitals reviewed. Constitutional: He is oriented to person, place, and time and well-developed, well-nourished, and in no distress.  HENT:  Head: Normocephalic and atraumatic.  Right Ear: External ear normal.  Left Ear: External ear normal.  Nose: Nose normal.  Mouth/Throat: Oropharynx is clear and moist. No oropharyngeal exudate.  TM within limits bilaterally.  Eyes: Conjunctivae are normal.  Neck: Neck supple. No thyromegaly present.  Cardiovascular: Normal rate, regular rhythm, normal heart sounds and intact distal pulses.   Pulmonary/Chest: Effort normal and breath sounds normal. No respiratory distress. He has no wheezes. He has no rales. He exhibits no tenderness.  Lymphadenopathy:    He has no cervical adenopathy.  Neurological: He is alert and oriented to person, place, and time.  Skin: Skin is warm and dry. No rash noted.  Psychiatric: Affect normal.   Assessment/Plan: Chest pain EKG reveals sinus bradycardia. Chest pain seems atypical for cardiac origin.  Possibly due to stress and anxiety. Also likely reactive airway disease giving mention of chest tightness and history of asthma.  Will Rx Albuterol inhaler to use as directed. Patient wishes to hold off  on anxiety medication at present.  Will check some basic labs today.  Will refer to Pulmonology for further evaluation.  Plantar fasciitis, bilateral Encourage good arch support. Roll cold can under feet several times per day to calm inflammation. Encouraged NSAID therapy. Follow-up if symptoms are not improving.  Frequent headaches Rx Imitrex. Will check CBC and BMP today. Caffeine may be of benefit. Eat more regularly.

## 2014-04-21 DIAGNOSIS — R51 Headache: Secondary | ICD-10-CM

## 2014-04-21 DIAGNOSIS — M722 Plantar fascial fibromatosis: Secondary | ICD-10-CM | POA: Insufficient documentation

## 2014-04-21 DIAGNOSIS — R519 Headache, unspecified: Secondary | ICD-10-CM | POA: Insufficient documentation

## 2014-04-21 LAB — CBC
HEMATOCRIT: 45.6 % (ref 39.0–52.0)
HEMOGLOBIN: 15.7 g/dL (ref 13.0–17.0)
MCHC: 34.4 g/dL (ref 30.0–36.0)
MCV: 93.3 fl (ref 78.0–100.0)
PLATELETS: 237 10*3/uL (ref 150.0–400.0)
RBC: 4.89 Mil/uL (ref 4.22–5.81)
RDW: 12.6 % (ref 11.5–15.5)
WBC: 6.9 10*3/uL (ref 4.0–10.5)

## 2014-04-21 LAB — BASIC METABOLIC PANEL
BUN: 17 mg/dL (ref 6–23)
CALCIUM: 9.7 mg/dL (ref 8.4–10.5)
CO2: 31 mEq/L (ref 19–32)
CREATININE: 1 mg/dL (ref 0.4–1.5)
Chloride: 100 mEq/L (ref 96–112)
GFR: 94.39 mL/min (ref >60.00)
Glucose, Bld: 94 mg/dL (ref 70–99)
Potassium: 4.1 mEq/L (ref 3.5–5.1)
Sodium: 138 mEq/L (ref 135–145)

## 2014-04-21 LAB — SEDIMENTATION RATE: Sed Rate: 3 mm/hr (ref 0–22)

## 2014-04-21 LAB — TSH: TSH: 0.83 u[IU]/mL (ref 0.35–4.50)

## 2014-04-21 LAB — ANA: Anti Nuclear Antibody(ANA): NEGATIVE

## 2014-04-21 NOTE — Assessment & Plan Note (Addendum)
EKG reveals sinus bradycardia. Chest pain seems atypical for cardiac origin.  Possibly due to stress and anxiety. Also likely reactive airway disease giving mention of chest tightness and history of asthma.  Will Rx Albuterol inhaler to use as directed. Patient wishes to hold off on anxiety medication at present.  Will check some basic labs today.  Will refer to Pulmonology for further evaluation.

## 2014-04-21 NOTE — Assessment & Plan Note (Signed)
Encourage good arch support. Roll cold can under feet several times per day to calm inflammation. Encouraged NSAID therapy. Follow-up if symptoms are not improving.

## 2014-04-21 NOTE — Assessment & Plan Note (Signed)
Rx Imitrex. Will check CBC and BMP today. Caffeine may be of benefit. Eat more regularly.

## 2014-11-18 ENCOUNTER — Telehealth: Payer: Self-pay | Admitting: Family Medicine

## 2014-11-18 NOTE — Telephone Encounter (Signed)
Pre visit letter for annual physical mailed °

## 2014-12-08 ENCOUNTER — Telehealth: Payer: Self-pay | Admitting: *Deleted

## 2014-12-08 NOTE — Telephone Encounter (Signed)
Unable to reach patient at time of Pre-Visit Call.  Left message for patient to return call when available.    

## 2014-12-09 ENCOUNTER — Encounter: Payer: 59 | Admitting: Family Medicine

## 2014-12-09 DIAGNOSIS — Z0289 Encounter for other administrative examinations: Secondary | ICD-10-CM

## 2014-12-23 ENCOUNTER — Encounter: Payer: Self-pay | Admitting: Family Medicine

## 2014-12-23 ENCOUNTER — Telehealth: Payer: Self-pay | Admitting: Family Medicine

## 2014-12-23 NOTE — Telephone Encounter (Signed)
yes

## 2014-12-23 NOTE — Telephone Encounter (Signed)
Pt was no show for cpe on 12/09/14- letter sent. Charge?

## 2017-10-11 ENCOUNTER — Other Ambulatory Visit: Payer: Self-pay | Admitting: Neurological Surgery

## 2017-10-11 DIAGNOSIS — M5416 Radiculopathy, lumbar region: Secondary | ICD-10-CM

## 2017-10-16 ENCOUNTER — Ambulatory Visit
Admission: RE | Admit: 2017-10-16 | Discharge: 2017-10-16 | Disposition: A | Discharge status: Home / Self Care | Payer: 59 | Source: Ambulatory Visit | Attending: Neurological Surgery | Admitting: Neurological Surgery

## 2017-10-16 DIAGNOSIS — M5416 Radiculopathy, lumbar region: Secondary | ICD-10-CM

## 2018-02-10 ENCOUNTER — Other Ambulatory Visit (HOSPITAL_COMMUNITY): Payer: Self-pay | Admitting: Neurological Surgery

## 2018-02-10 DIAGNOSIS — M47816 Spondylosis without myelopathy or radiculopathy, lumbar region: Secondary | ICD-10-CM

## 2018-02-17 ENCOUNTER — Ambulatory Visit (HOSPITAL_COMMUNITY): Payer: 59

## 2018-02-25 ENCOUNTER — Ambulatory Visit (HOSPITAL_COMMUNITY): Payer: 59

## 2018-02-26 ENCOUNTER — Encounter (HOSPITAL_COMMUNITY): Payer: Self-pay

## 2018-02-26 ENCOUNTER — Ambulatory Visit (HOSPITAL_COMMUNITY)
Admission: RE | Admit: 2018-02-26 | Discharge: 2018-02-26 | Disposition: A | Discharge status: Home / Self Care | Payer: 59 | Source: Ambulatory Visit | Attending: Neurological Surgery | Admitting: Neurological Surgery

## 2018-03-06 ENCOUNTER — Ambulatory Visit (HOSPITAL_COMMUNITY)
Admission: RE | Admit: 2018-03-06 | Discharge: 2018-03-06 | Disposition: A | Discharge status: Home / Self Care | Payer: 59 | Source: Ambulatory Visit | Attending: Neurological Surgery | Admitting: Neurological Surgery

## 2018-03-06 ENCOUNTER — Encounter (HOSPITAL_COMMUNITY): Payer: Self-pay

## 2018-03-06 ENCOUNTER — Ambulatory Visit (HOSPITAL_COMMUNITY): Payer: 59

## 2018-03-06 DIAGNOSIS — M47816 Spondylosis without myelopathy or radiculopathy, lumbar region: Secondary | ICD-10-CM | POA: Diagnosis present

## 2018-03-06 DIAGNOSIS — M5137 Other intervertebral disc degeneration, lumbosacral region: Secondary | ICD-10-CM | POA: Diagnosis not present

## 2018-03-06 DIAGNOSIS — M5126 Other intervertebral disc displacement, lumbar region: Secondary | ICD-10-CM | POA: Diagnosis not present

## 2018-09-29 DIAGNOSIS — M51369 Other intervertebral disc degeneration, lumbar region without mention of lumbar back pain or lower extremity pain: Secondary | ICD-10-CM | POA: Insufficient documentation

## 2018-11-25 ENCOUNTER — Other Ambulatory Visit: Payer: Self-pay | Admitting: Orthopedic Surgery

## 2018-11-25 DIAGNOSIS — M5136 Other intervertebral disc degeneration, lumbar region: Secondary | ICD-10-CM

## 2018-12-25 NOTE — Discharge Instructions (Signed)

## 2018-12-26 ENCOUNTER — Other Ambulatory Visit: Payer: Self-pay

## 2018-12-26 ENCOUNTER — Ambulatory Visit
Admission: RE | Admit: 2018-12-26 | Discharge: 2018-12-26 | Disposition: A | Discharge status: Home / Self Care | Payer: 59 | Source: Ambulatory Visit | Attending: Orthopedic Surgery | Admitting: Orthopedic Surgery

## 2018-12-26 ENCOUNTER — Telehealth: Payer: Self-pay

## 2018-12-26 DIAGNOSIS — M5136 Other intervertebral disc degeneration, lumbar region: Secondary | ICD-10-CM

## 2018-12-26 MED ORDER — IOPAMIDOL (ISOVUE-M 200) INJECTION 41%: 1.0000 mL | Freq: Once | INTRAMUSCULAR | Status: DC

## 2018-12-26 MED ORDER — VANCOMYCIN HCL IN DEXTROSE 1-5 GM/200ML-% IV SOLN
1000.0000 mg | Freq: Once | INTRAVENOUS | Status: AC
  Administered 2018-12-26: 1000 mg via INTRAVENOUS

## 2018-12-26 NOTE — Telephone Encounter (Signed)
7588[name] with Robin at Dr. Shon Baton' office to clarify intradiscal order for patient's visit with [place] later this morning.  She confirmed what the order said, that it is just an intradiscal injection and not a discogram.  She did clarify that the injection should be done using Marcaine and no steroid.

## 2022-05-25 ENCOUNTER — Other Ambulatory Visit (HOSPITAL_COMMUNITY): Payer: Self-pay

## 2022-05-25 MED ORDER — HYDROCODONE-ACETAMINOPHEN 10-325 MG PO TABS
1.0000 | ORAL_TABLET | ORAL | 0 refills | Status: DC
  Filled 2022-05-25: qty 26, 5d supply, fill #0
  Filled 2022-05-28: qty 150, 30d supply, fill #0

## 2022-05-25 MED ORDER — CYCLOBENZAPRINE HCL 10 MG PO TABS
5.0000 mg | ORAL_TABLET | Freq: Three times a day (TID) | ORAL | 0 refills | Status: DC | PRN
  Filled 2022-05-25: qty 90, 30d supply, fill #0

## 2022-05-25 MED ORDER — PREGABALIN 75 MG PO CAPS
75.0000 mg | ORAL_CAPSULE | Freq: Three times a day (TID) | ORAL | 0 refills | Status: DC
  Filled 2022-05-25: qty 90, 30d supply, fill #0

## 2022-05-28 ENCOUNTER — Other Ambulatory Visit (HOSPITAL_COMMUNITY): Payer: Self-pay

## 2022-06-25 ENCOUNTER — Other Ambulatory Visit (HOSPITAL_COMMUNITY): Payer: Self-pay

## 2022-06-25 MED ORDER — CYCLOBENZAPRINE HCL 10 MG PO TABS
5.0000 mg | ORAL_TABLET | Freq: Three times a day (TID) | ORAL | 0 refills | Status: DC | PRN
  Filled 2022-06-25: qty 90, 30d supply, fill #0

## 2022-06-25 MED ORDER — HYDROCODONE-ACETAMINOPHEN 10-325 MG PO TABS
1.0000 | ORAL_TABLET | ORAL | 0 refills | Status: DC
  Filled 2022-06-25: qty 150, 30d supply, fill #0

## 2022-06-25 MED ORDER — PREGABALIN 75 MG PO CAPS
75.0000 mg | ORAL_CAPSULE | Freq: Three times a day (TID) | ORAL | 0 refills | Status: DC
  Filled 2022-06-25: qty 90, 30d supply, fill #0

## 2022-06-25 MED ORDER — MELOXICAM 15 MG PO TABS
15.0000 mg | ORAL_TABLET | Freq: Every day | ORAL | 1 refills | Status: DC
  Filled 2022-06-25: qty 90, 90d supply, fill #0
  Filled 2023-03-22: qty 90, 90d supply, fill #1

## 2022-06-26 ENCOUNTER — Other Ambulatory Visit (HOSPITAL_COMMUNITY): Payer: Self-pay

## 2022-07-26 ENCOUNTER — Other Ambulatory Visit (HOSPITAL_COMMUNITY): Payer: Self-pay

## 2022-07-26 MED ORDER — HYDROCODONE-ACETAMINOPHEN 10-325 MG PO TABS
1.0000 | ORAL_TABLET | Freq: Every day | ORAL | 0 refills | Status: DC
  Filled 2022-07-26: qty 150, 30d supply, fill #0

## 2022-07-26 MED ORDER — PREGABALIN 75 MG PO CAPS
75.0000 mg | ORAL_CAPSULE | Freq: Three times a day (TID) | ORAL | 0 refills | Status: DC
  Filled 2022-07-26: qty 90, 30d supply, fill #0

## 2022-07-27 ENCOUNTER — Other Ambulatory Visit (HOSPITAL_COMMUNITY): Payer: Self-pay

## 2022-08-28 ENCOUNTER — Other Ambulatory Visit (HOSPITAL_COMMUNITY): Payer: Self-pay

## 2022-08-28 MED ORDER — PREGABALIN 75 MG PO CAPS
75.0000 mg | ORAL_CAPSULE | Freq: Three times a day (TID) | ORAL | 0 refills | Status: DC | PRN
  Filled 2022-08-28: qty 90, 30d supply, fill #0

## 2022-08-28 MED ORDER — CEPHALEXIN 500 MG PO CAPS
1000.0000 mg | ORAL_CAPSULE | Freq: Two times a day (BID) | ORAL | 0 refills | Status: DC
  Filled 2022-08-28: qty 28, 7d supply, fill #0

## 2022-08-28 MED ORDER — HYDROCODONE-ACETAMINOPHEN 10-325 MG PO TABS
1.0000 | ORAL_TABLET | Freq: Every day | ORAL | 0 refills | Status: DC | PRN
  Filled 2022-08-28: qty 150, 30d supply, fill #0

## 2022-09-25 ENCOUNTER — Other Ambulatory Visit (HOSPITAL_COMMUNITY): Payer: Self-pay

## 2022-09-25 MED ORDER — HYDROCODONE-ACETAMINOPHEN 10-325 MG PO TABS
1.0000 | ORAL_TABLET | Freq: Every day | ORAL | 0 refills | Status: DC
  Filled 2022-09-25 – 2022-09-27 (×3): qty 150, 30d supply, fill #0

## 2022-09-25 MED ORDER — PREGABALIN 75 MG PO CAPS
75.0000 mg | ORAL_CAPSULE | Freq: Three times a day (TID) | ORAL | 0 refills | Status: DC
  Filled 2022-09-25 – 2022-09-27 (×2): qty 90, 30d supply, fill #0

## 2022-09-25 MED ORDER — CYCLOBENZAPRINE HCL 10 MG PO TABS
5.0000 mg | ORAL_TABLET | Freq: Three times a day (TID) | ORAL | 0 refills | Status: DC | PRN
  Filled 2022-09-25: qty 90, 30d supply, fill #0

## 2022-09-27 ENCOUNTER — Other Ambulatory Visit (HOSPITAL_COMMUNITY): Payer: Self-pay

## 2022-09-27 ENCOUNTER — Other Ambulatory Visit: Payer: Self-pay

## 2022-09-28 ENCOUNTER — Other Ambulatory Visit (HOSPITAL_COMMUNITY): Payer: Self-pay

## 2022-10-24 ENCOUNTER — Other Ambulatory Visit (HOSPITAL_COMMUNITY): Payer: Self-pay

## 2022-10-24 MED ORDER — PREGABALIN 75 MG PO CAPS: 75.0000 mg | ORAL_CAPSULE | Freq: Three times a day (TID) | ORAL | 0 refills | Status: DC

## 2022-10-24 MED ORDER — MELOXICAM 15 MG PO TABS
15.0000 mg | ORAL_TABLET | Freq: Every day | ORAL | 1 refills | Status: DC
  Filled 2022-10-24: qty 90, 90d supply, fill #0

## 2022-10-24 MED ORDER — CYCLOBENZAPRINE HCL 10 MG PO TABS
5.0000 mg | ORAL_TABLET | Freq: Three times a day (TID) | ORAL | 0 refills | Status: DC | PRN
  Filled 2022-10-24: qty 90, 30d supply, fill #0

## 2022-10-24 MED ORDER — NALOXONE HCL 4 MG/0.1ML NA LIQD
NASAL | 3 refills | Status: DC
  Filled 2022-10-24: qty 2, 2d supply, fill #0

## 2022-10-24 MED ORDER — HYDROCODONE-ACETAMINOPHEN 10-325 MG PO TABS
1.0000 | ORAL_TABLET | Freq: Every day | ORAL | 0 refills | Status: DC
  Filled 2022-10-26: qty 150, 30d supply, fill #0

## 2022-10-26 ENCOUNTER — Other Ambulatory Visit (HOSPITAL_COMMUNITY): Payer: Self-pay

## 2022-11-22 ENCOUNTER — Other Ambulatory Visit (HOSPITAL_COMMUNITY): Payer: Self-pay

## 2022-11-22 MED ORDER — HYDROCODONE-ACETAMINOPHEN 10-325 MG PO TABS
1.0000 | ORAL_TABLET | Freq: Every day | ORAL | 0 refills | Status: DC
  Filled 2022-11-22 – 2022-11-23 (×2): qty 150, 30d supply, fill #0

## 2022-11-22 MED ORDER — PREGABALIN 75 MG PO CAPS
75.0000 mg | ORAL_CAPSULE | Freq: Three times a day (TID) | ORAL | 0 refills | Status: DC
  Filled 2022-11-22: qty 90, 30d supply, fill #0

## 2022-11-23 ENCOUNTER — Other Ambulatory Visit: Payer: Self-pay

## 2022-11-23 ENCOUNTER — Other Ambulatory Visit (HOSPITAL_COMMUNITY): Payer: Self-pay

## 2022-12-20 ENCOUNTER — Other Ambulatory Visit (HOSPITAL_COMMUNITY): Payer: Self-pay

## 2022-12-20 MED ORDER — PREGABALIN 75 MG PO CAPS
75.0000 mg | ORAL_CAPSULE | Freq: Three times a day (TID) | ORAL | 0 refills | Status: DC
  Filled 2022-12-20: qty 90, 30d supply, fill #0

## 2022-12-20 MED ORDER — HYDROCODONE-ACETAMINOPHEN 10-325 MG PO TABS
1.0000 | ORAL_TABLET | Freq: Every day | ORAL | 0 refills | Status: DC
  Filled 2022-12-20 – 2022-12-25 (×2): qty 150, 30d supply, fill #0

## 2022-12-25 ENCOUNTER — Other Ambulatory Visit (HOSPITAL_COMMUNITY): Payer: Self-pay

## 2022-12-25 ENCOUNTER — Other Ambulatory Visit: Payer: Self-pay

## 2023-01-18 ENCOUNTER — Other Ambulatory Visit (HOSPITAL_COMMUNITY): Payer: Self-pay

## 2023-01-18 MED ORDER — PREGABALIN 75 MG PO CAPS
75.0000 mg | ORAL_CAPSULE | Freq: Three times a day (TID) | ORAL | 0 refills | Status: DC
  Filled 2023-01-18: qty 90, 30d supply, fill #0

## 2023-01-18 MED ORDER — CYCLOBENZAPRINE HCL 10 MG PO TABS
5.0000 mg | ORAL_TABLET | Freq: Three times a day (TID) | ORAL | 0 refills | Status: DC | PRN
  Filled 2023-01-18: qty 90, 30d supply, fill #0

## 2023-01-18 MED ORDER — HYDROCODONE-ACETAMINOPHEN 10-325 MG PO TABS
1.0000 | ORAL_TABLET | Freq: Every day | ORAL | 0 refills | Status: DC
  Filled 2023-01-18 – 2023-01-22 (×3): qty 150, 30d supply, fill #0

## 2023-01-22 ENCOUNTER — Other Ambulatory Visit (HOSPITAL_COMMUNITY): Payer: Self-pay

## 2023-01-22 ENCOUNTER — Other Ambulatory Visit: Payer: Self-pay

## 2023-01-25 ENCOUNTER — Other Ambulatory Visit (HOSPITAL_COMMUNITY): Payer: Self-pay

## 2023-01-25 ENCOUNTER — Other Ambulatory Visit: Payer: Self-pay

## 2023-02-19 ENCOUNTER — Other Ambulatory Visit (HOSPITAL_COMMUNITY): Payer: Self-pay

## 2023-02-19 ENCOUNTER — Other Ambulatory Visit: Payer: Self-pay

## 2023-02-19 MED ORDER — HYDROCODONE-ACETAMINOPHEN 10-325 MG PO TABS
1.0000 | ORAL_TABLET | Freq: Every day | ORAL | 0 refills | Status: DC | PRN
  Filled 2023-02-21: qty 150, 32d supply, fill #0
  Filled 2023-02-22: qty 150, 30d supply, fill #0

## 2023-02-19 MED ORDER — PREGABALIN 75 MG PO CAPS
75.0000 mg | ORAL_CAPSULE | Freq: Three times a day (TID) | ORAL | 0 refills | Status: DC
  Filled 2023-02-19: qty 90, 30d supply, fill #0

## 2023-02-19 MED ORDER — CYCLOBENZAPRINE HCL 10 MG PO TABS
5.0000 mg | ORAL_TABLET | Freq: Three times a day (TID) | ORAL | 0 refills | Status: DC | PRN
  Filled 2023-02-19: qty 90, 30d supply, fill #0

## 2023-02-21 ENCOUNTER — Other Ambulatory Visit (HOSPITAL_COMMUNITY): Payer: Self-pay

## 2023-02-22 ENCOUNTER — Other Ambulatory Visit (HOSPITAL_COMMUNITY): Payer: Self-pay

## 2023-03-18 ENCOUNTER — Other Ambulatory Visit (HOSPITAL_COMMUNITY): Payer: Self-pay

## 2023-03-18 MED ORDER — MELOXICAM 15 MG PO TABS
15.0000 mg | ORAL_TABLET | Freq: Every day | ORAL | 1 refills | Status: DC
  Filled 2023-03-18: qty 90, 90d supply, fill #0

## 2023-03-18 MED ORDER — HYDROCODONE-ACETAMINOPHEN 10-325 MG PO TABS
1.0000 | ORAL_TABLET | Freq: Every day | ORAL | 0 refills | Status: DC
  Filled 2023-03-18 – 2023-03-22 (×2): qty 150, 30d supply, fill #0

## 2023-03-19 ENCOUNTER — Encounter: Payer: Self-pay | Admitting: Family Medicine

## 2023-03-19 ENCOUNTER — Other Ambulatory Visit: Payer: Self-pay

## 2023-03-19 ENCOUNTER — Ambulatory Visit: Payer: 59 | Admitting: Family Medicine

## 2023-03-19 ENCOUNTER — Other Ambulatory Visit (HOSPITAL_COMMUNITY): Payer: Self-pay

## 2023-03-19 VITALS — BP 132/84 | HR 80 | Temp 97.5°F | Ht 71.0 in | Wt 173.8 lb

## 2023-03-19 DIAGNOSIS — R091 Pleurisy: Secondary | ICD-10-CM

## 2023-03-19 DIAGNOSIS — M5430 Sciatica, unspecified side: Secondary | ICD-10-CM | POA: Diagnosis not present

## 2023-03-19 DIAGNOSIS — R0600 Dyspnea, unspecified: Secondary | ICD-10-CM

## 2023-03-19 DIAGNOSIS — R079 Chest pain, unspecified: Secondary | ICD-10-CM

## 2023-03-19 DIAGNOSIS — J4520 Mild intermittent asthma, uncomplicated: Secondary | ICD-10-CM | POA: Diagnosis not present

## 2023-03-19 MED ORDER — ALBUTEROL SULFATE HFA 108 (90 BASE) MCG/ACT IN AERS
2.0000 | INHALATION_SPRAY | Freq: Four times a day (QID) | RESPIRATORY_TRACT | 0 refills | Status: DC | PRN
  Filled 2023-03-19: qty 6.7, 25d supply, fill #0

## 2023-03-19 NOTE — Progress Notes (Addendum)
Assessment/Plan:   Problem List Items Addressed This Visit       Respiratory   Reactive airway disease, mild intermittent, uncomplicated   Relevant Medications   albuterol (VENTOLIN HFA) 108 (90 Base) MCG/ACT inhaler   Other Relevant Orders   DG Chest 2 View   Pleurisy     Nervous and Auditory   Sciatica    Ongoing management with current medications (hydrocodone, Flexeril, Lyrica, and meloxicam) Plan: Continue current medication regimen Monitor for side effects or need for dosage adjustments        Other   Chest pain - Primary    Chronic, constant pain possibly related to exposure to fumes and physical activity; differential includes musculoskeletal, cardiac, and respiratory etiologies.  Differential diagnosis:  Musculoskeletal pain (e.g., costochondritis, muscular strain) Pleural involvement (e.g., pleurisy) Cardiac origin (less likely given normal EKG but not ruled out) Pulmonary complications (e.g., inhalation injury, asthma)  Plan:  PFTs within normal limits. Prescribe albuterol inhaler (1-2 puffs as needed for shortness of breath) Order a chest X-ray to rule out structural abnormalities or infections Follow up with pulmonology for comprehensive respiratory evaluation Educate the patient on ED and return precautions Schedule a follow-up appointment in 6 weeks for a physical and fasting labs, including cholesterol, to assess cardiovascular risk      Relevant Orders   EKG 12-Lead (Completed)   Dyspnea    Medications Discontinued During This Encounter  Medication Reason   cephALEXin (KEFLEX) 500 MG capsule    cyclobenzaprine (FLEXERIL) 10 MG tablet    cyclobenzaprine (FLEXERIL) 10 MG tablet    HYDROcodone-acetaminophen (NORCO) 10-325 MG tablet    HYDROcodone-acetaminophen (NORCO) 10-325 MG tablet    HYDROcodone-acetaminophen (NORCO) 10-325 MG tablet    HYDROcodone-acetaminophen (NORCO) 10-325 MG tablet    hydrocortisone (ANUSOL-HC) 25 MG suppository     ibuprofen (ADVIL,MOTRIN) 200 MG tablet    meloxicam (MOBIC) 15 MG tablet    meloxicam (MOBIC) 15 MG tablet    pregabalin (LYRICA) 75 MG capsule    pregabalin (LYRICA) 75 MG capsule    pregabalin (LYRICA) 75 MG capsule    pregabalin (LYRICA) 75 MG capsule    pregabalin (LYRICA) 75 MG capsule    pregabalin (LYRICA) 75 MG capsule    SUMAtriptan (IMITREX) 50 MG tablet    albuterol (PROVENTIL HFA;VENTOLIN HFA) 108 (90 BASE) MCG/ACT inhaler Reorder    Return in about 4 weeks (around 04/16/2023) for physical (fasting labs).    Subjective:   Encounter date: 03/19/2023  William Oliver is a 42 y.o. male who has ALLERGIC RHINITIS; ASTHMA; Chest pain; Cough; Dyspnea; Cellulitis and abscess; Plantar fasciitis, bilateral; Frequent headaches; Reactive airway disease, mild intermittent, uncomplicated; Pleurisy; and Sciatica on their problem list..   He  has a past medical history of Laceration and MRSA (methicillin resistant Staphylococcus aureus)..   Chief Complaint: Left-sided chest pain and breathing difficulties.  History of Present Illness:  Chest Pain and Breathing Issues. The patient, William Oliver, a 42 year old male, presents with a long-standing history of left-sided chest pain and breathing difficulties. The patient reports that the pain started years ago, characterized by episodic sharp pain localized to a specific area on the left side of the chest. The pain used to come and go, but recently it has become constant, lasting for days at a time. He describes the pain as an aching, sore feeling that intensifies with deep breaths, leading him to take shallow breaths to avoid exacerbating the pain. The patient notes that exposure to dust, paint,  welding fumes, and even mowing the lawn can irritate the condition and exacerbate the symptoms. Pain relief is not achieved by his current medications, which include hydrocodone, Flexeril, and Lyrica taken lumbar back pain.   Review of Systems  Constitutional:   Negative for chills, diaphoresis, fever, malaise/fatigue and weight loss.  HENT:  Negative for congestion, ear discharge, ear pain and hearing loss.   Eyes:  Negative for blurred vision, double vision, photophobia, pain, discharge and redness.  Respiratory:  Positive for shortness of breath. Negative for cough, sputum production and wheezing.   Cardiovascular:  Positive for chest pain. Negative for palpitations.  Gastrointestinal:  Negative for abdominal pain, blood in stool, constipation, diarrhea, heartburn, melena, nausea and vomiting.  Genitourinary:  Negative for dysuria, flank pain, frequency, hematuria and urgency.  Musculoskeletal:  Positive for back pain. Negative for myalgias.  Skin:  Negative for itching and rash.  Neurological:  Negative for dizziness, tingling, tremors, speech change, seizures, loss of consciousness, weakness and headaches.  Endo/Heme/Allergies:  Negative for polydipsia.  Psychiatric/Behavioral:  Negative for depression, hallucinations, memory loss, substance abuse and suicidal ideas. The patient does not have insomnia.   All other systems reviewed and are negative.    Past Surgical History:  Procedure Laterality Date   HAND SURGERY     TENDON REPAIR Left 01/04/2014   Procedure: TENDON REPAIR;  Surgeon: Marlowe Shores, MD;  Location: [facility];  Service: Orthopedics;  Laterality: Left;   WISDOM TOOTH EXTRACTION      Outpatient Medications Prior to Visit  Medication Sig Dispense Refill   HYDROcodone-acetaminophen (NORCO) 10-325 MG tablet Take 1 tablet by mouth 5 (five) times daily as needed for pain 150 tablet 0   meloxicam (MOBIC) 15 MG tablet Take 1 tablet (15 mg total) by mouth daily. 90 tablet 1   naloxone (NARCAN) nasal spray 4 mg/0.1 mL Use 1 spray as needed, IF FOUND UNRESPONSIVE THEN SPRAY THIS INTO NOSE AND CALL 911 IMMEDIATELY 2 each 3   pregabalin (LYRICA) 75 MG capsule Take 1 capsule (75 mg total) by mouth 3 (three) times daily  for neuropathy 90 capsule 0   cyclobenzaprine (FLEXERIL) 10 MG tablet Take 1/2-1 tablet (5-10 mg total) by mouth 3 (three) times daily as needed for muscle spasms. 90 tablet 0   albuterol (PROVENTIL HFA;VENTOLIN HFA) 108 (90 BASE) MCG/ACT inhaler Inhale 2 puffs into the lungs every 6 (six) hours as needed for wheezing or shortness of breath. (Patient not taking: Reported on 03/19/2023) 1 Inhaler 0   cephALEXin (KEFLEX) 500 MG capsule Take 2 capsules (1,000 mg total) by mouth 2 (two) times daily for 7 days (Patient not taking: Reported on 03/19/2023) 28 capsule 0   cyclobenzaprine (FLEXERIL) 10 MG tablet Take 1/2-1 tablet (5-10 mg total) by mouth 3 (three) times daily as needed fro muscle spasms (Patient not taking: Reported on 03/19/2023) 90 tablet 0   cyclobenzaprine (FLEXERIL) 10 MG tablet Take 1/2-1 tablet (5-10 mg total) by mouth 3 (three) times daily as needed for muscle spasms (Patient not taking: Reported on 03/19/2023) 90 tablet 0   HYDROcodone-acetaminophen (NORCO) 10-325 MG tablet Take 1 tablet by mouth 5 times daily as needed for pain (Patient not taking: Reported on 03/19/2023) 150 tablet 0   HYDROcodone-acetaminophen (NORCO) 10-325 MG tablet Take 1 tablet by mouth 5 (five) times daily as needed for pain (Patient not taking: Reported on 03/19/2023) 150 tablet 0   HYDROcodone-acetaminophen (NORCO) 10-325 MG tablet Take 1 tablet by mouth 5 (five) times  daily as needed for pain. (Patient not taking: Reported on 03/19/2023) 150 tablet 0   HYDROcodone-acetaminophen (NORCO) 10-325 MG tablet Take 1 tablet by mouth 5 (five) times daily as needed for pain (Patient not taking: Reported on 03/19/2023) 150 tablet 0   hydrocortisone (ANUSOL-HC) 25 MG suppository Place 1 suppository (25 mg total) rectally 2 (two) times daily. (Patient not taking: Reported on 03/19/2023) 12 suppository 0   ibuprofen (ADVIL,MOTRIN) 200 MG tablet Take 200 mg by mouth every 6 (six) hours as needed. (Patient not taking: Reported on  03/19/2023)     meloxicam (MOBIC) 15 MG tablet Take 1 tablet (15 mg total) by mouth daily. (Patient not taking: Reported on 03/19/2023) 90 tablet 1   meloxicam (MOBIC) 15 MG tablet Take 1 tablet (15 mg total) by mouth daily. (Patient not taking: Reported on 03/19/2023) 90 tablet 1   pregabalin (LYRICA) 75 MG capsule Take 1 capsule (75 mg total) by mouth 3 (three) times daily for neuropathy (Patient not taking: Reported on 03/19/2023) 90 capsule 0   pregabalin (LYRICA) 75 MG capsule Take 1 capsule (75 mg total) by mouth 3 (three) times daily for neuropathy (Patient not taking: Reported on 03/19/2023) 90 capsule 0   pregabalin (LYRICA) 75 MG capsule Take 1 capsule (75 mg total) by mouth 3 (three) times daily for neuropathy (Patient not taking: Reported on 03/19/2023) 90 capsule 0   pregabalin (LYRICA) 75 MG capsule Take 1 capsule (75 mg total) by mouth 3 (three) times daily for neuropathy (Patient not taking: Reported on 03/19/2023) 90 capsule 0   pregabalin (LYRICA) 75 MG capsule Take 1 capsule (75 mg total) by mouth 3 (three) times daily for neuropathy (Patient not taking: Reported on 03/19/2023) 90 capsule 0   pregabalin (LYRICA) 75 MG capsule Take 1 capsule (75 mg total) by mouth 3 (three) times daily for neuropathy. (Patient not taking: Reported on 03/19/2023) 90 capsule 0   SUMAtriptan (IMITREX) 50 MG tablet Take 1 tablet (50 mg total) by mouth once. May repeat in 2 hours if headache persists or recurs. (Patient not taking: Reported on 03/19/2023) 10 tablet 0   No facility-administered medications prior to visit.    Family History  Problem Relation Age of Onset   Lupus Mother    COPD Father    Heart disease Maternal Uncle    Throat cancer Father        never smoker   Lung cancer Paternal Grandfather        was a smoker   Emphysema Maternal Grandmother    Emphysema Paternal Grandfather    Clotting disorder Maternal Grandmother     Social History   Socioeconomic History   Marital status:  Single    Spouse name: Not on file   Number of children: 0   Years of education: Not on file   Highest education level: Not on file  Occupational History   Occupation: Biomedical engineer: [employer]  Tobacco Use   Smoking status: Never    Passive exposure: Never   Smokeless tobacco: Never  Vaping Use   Vaping status: Never Used  Substance and Sexual Activity   Alcohol use: No   Drug use: No   Sexual activity: Yes  Other Topics Concern   Not on file  Social History Narrative   Not on file   Social Determinants of Health   Financial Resource Strain: Not on file  Food Insecurity: Not on file  Transportation Needs: Not on file  Physical Activity:  Not on file  Stress: Not on file  Social Connections: Not on file  Intimate Partner Violence: Not on file                                                                                                  Objective:  Physical Exam: BP 132/84 (BP Location: Left Arm, Patient Position: Sitting, Cuff Size: Large)   Pulse 80   Temp (!) 97.5 F (36.4 C) (Temporal)   Ht 5\' 11"  (1.803 m)   Wt 173 lb 12.8 oz (78.8 kg)   SpO2 99%   BMI 24.24 kg/m     Physical Exam Constitutional:      Appearance: Normal appearance.  HENT:     Head: Normocephalic and atraumatic.     Right Ear: Hearing normal.     Left Ear: Hearing normal.     Nose: Nose normal.  Eyes:     General: No scleral icterus.       Right eye: No discharge.        Left eye: No discharge.     Extraocular Movements: Extraocular movements intact.  Cardiovascular:     Rate and Rhythm: Normal rate and regular rhythm.     Heart sounds: Normal heart sounds.  Pulmonary:     Effort: Pulmonary effort is normal.     Breath sounds: Normal breath sounds.  Chest:     Chest wall: No tenderness.  Abdominal:     Palpations: Abdomen is soft.     Tenderness: There is no abdominal tenderness.  Skin:    General: Skin is warm.     Findings: No rash.  Neurological:      General: No focal deficit present.     Mental Status: He is alert.     Cranial Nerves: No cranial nerve deficit.  Psychiatric:        Mood and Affect: Mood normal.        Behavior: Behavior normal.        Thought Content: Thought content normal.        Judgment: Judgment normal.   EKG Normal sinus rhythm, no abnormalities or ST changes  Spirometry  FVC 5.8  FEV1 4.54 FEV1/FVC ratio 0.78 Normal spirometry No results found.  No results found for this or any previous visit (from the past 2160 hour(s)).      Garner Nash, MD, MS

## 2023-03-19 NOTE — Patient Instructions (Addendum)
For xray, go to:    [facility] [address] [place], [place], [place] [address] Phone: [phone]  Take your prescribed albuterol inhaler as needed for shortness of breath Get the chest X-ray done as soon as possible to assess other causes of chest pain If you experience any worsening of chest pain, shortness of breath, or any new symptoms, go to the emergency department immediately.

## 2023-03-19 NOTE — Assessment & Plan Note (Signed)
Chronic, constant pain possibly related to exposure to fumes and physical activity; differential includes musculoskeletal, cardiac, and respiratory etiologies.  Differential diagnosis:  Musculoskeletal pain (e.g., costochondritis, muscular strain) Pleural involvement (e.g., pleurisy) Cardiac origin (less likely given normal EKG but not ruled out) Pulmonary complications (e.g., inhalation injury, asthma)  Plan:  PFTs within normal limits. Prescribe albuterol inhaler (1-2 puffs as needed for shortness of breath) Order a chest X-ray to rule out structural abnormalities or infections Follow up with pulmonology for comprehensive respiratory evaluation Educate the patient on ED and return precautions Schedule a follow-up appointment in 6 weeks for a physical and fasting labs, including cholesterol, to assess cardiovascular risk

## 2023-03-19 NOTE — Assessment & Plan Note (Signed)
Ongoing management with current medications (hydrocodone, Flexeril, Lyrica, and meloxicam) Plan: Continue current medication regimen Monitor for side effects or need for dosage adjustments

## 2023-03-22 ENCOUNTER — Other Ambulatory Visit (HOSPITAL_COMMUNITY): Payer: Self-pay

## 2023-03-25 ENCOUNTER — Other Ambulatory Visit (HOSPITAL_COMMUNITY): Payer: Self-pay

## 2023-03-26 ENCOUNTER — Other Ambulatory Visit (HOSPITAL_COMMUNITY): Payer: Self-pay

## 2023-03-26 MED ORDER — CYCLOBENZAPRINE HCL 10 MG PO TABS
5.0000 mg | ORAL_TABLET | Freq: Three times a day (TID) | ORAL | 0 refills | Status: DC | PRN
  Filled 2023-03-26: qty 90, 30d supply, fill #0

## 2023-03-27 ENCOUNTER — Other Ambulatory Visit (HOSPITAL_COMMUNITY): Payer: Self-pay

## 2023-03-27 ENCOUNTER — Telehealth: Payer: Self-pay | Admitting: Family Medicine

## 2023-03-27 NOTE — Telephone Encounter (Signed)
Spoke with Angelica and she stated that Dr. Janee Morn. Discontinued the patient's hydrocodone-acetaminophen. I advised her that at the pt's visit Dr. Janee Morn didn't discontinue the rx and read his OV note "Ongoing management with current medications (hydrocodone, Flexeril, Lyrica, and meloxicam) Plan: Continue current medication regimen Monitor for side effects or need for dosage adjustments".  I advised her that it's still active on our end. She then stated "Well you should contact the pharmacy or can you talk to Dr. Janee Morn to confirm." I spoke with Dr. Janee Morn and he declined and stated that he didn't prescribe the rx and the patient should contact us pertaining to any medication concerns and he didn't feel comfortable discussing patient care with another party. I verbalized that to Angelica and she placed me on speaker to verbalized it to her coworker in the background again and said "Ok".

## 2023-03-27 NOTE — Telephone Encounter (Signed)
[facility] is needing a cb, they are wanting to know why pt's Rx #: [id number]  HYDROcodone-acetaminophen (NORCO) 10-325 MG tablet [[id number]]  DISCONTINUED was disconti'ed. They think Dr Janee Morn did this. Please call Angelica at [phone].

## 2023-03-27 NOTE — Telephone Encounter (Signed)
Tried calling Angelica back and was transferred to another number and the call ended.   I don't show where Dr. Janee Morn discontinued pt's medication. It expired at the end of August.

## 2023-03-27 NOTE — Telephone Encounter (Signed)
Spoke with patient and advised him of the annotation below.

## 2023-03-28 ENCOUNTER — Other Ambulatory Visit (HOSPITAL_COMMUNITY): Payer: Self-pay

## 2023-03-28 MED ORDER — HYDROCODONE-ACETAMINOPHEN 10-325 MG PO TABS
1.0000 | ORAL_TABLET | Freq: Every day | ORAL | 0 refills | Status: DC | PRN
  Filled 2023-03-28: qty 150, 30d supply, fill #0

## 2023-03-28 NOTE — Telephone Encounter (Signed)
Pt is calling back checking on status of this message. He has been told this script was cancelled by our office. Please advise pt at [phone]

## 2023-04-08 ENCOUNTER — Ambulatory Visit: Payer: 59 | Admitting: Student in an Organized Health Care Education/Training Program

## 2023-04-08 VITALS — BP 116/86 | HR 67 | Temp 97.6°F | Ht 71.0 in | Wt 173.6 lb

## 2023-04-08 DIAGNOSIS — R0602 Shortness of breath: Secondary | ICD-10-CM | POA: Diagnosis not present

## 2023-04-08 NOTE — Progress Notes (Signed)
Assessment & Plan:   1. Shortness of breath  William Oliver is a pleasant 42 year old male with an occupational history most notable for welding who presents with symptoms of shortness of breath and chest tightness.  The pain is nonradiating and localized to 1 spot in the chest.  Given the patient's history and presentation, I do not suspect that this is cardiac in nature.  Given chronicity I suspect this is musculoskeletal in etiology.  I also suspect a component of pneumoconiosis secondary to arc welding but do not suspect metal fume fever given absence of systemic signs of illness.  For workup, I will obtain a high-resolution chest CT to rule out any signs of pneumoconiosis.  I will also obtain a pulmonary function test to assess spirometry, lung volumes, and DLCO for any abnormalities.   - Pulmonary Function Test [facility] Only; Future - CT CHEST HIGH RESOLUTION; Future   Return in about 2 months (around 06/08/2023).  I spent 45 minutes caring for this patient today, including preparing to see the patient, obtaining a medical history , reviewing a separately obtained history, performing a medically appropriate examination and/or evaluation, counseling and educating the patient/family/caregiver, ordering medications, tests, or procedures, and documenting clinical information in the electronic health record  William Chute, MD [facility] 04/08/2023 3:56 PM    End of visit medications:  No orders of the defined types were placed in this encounter.    Current Outpatient Medications:    albuterol (VENTOLIN HFA) 108 (90 Base) MCG/ACT inhaler, Inhale 2 puffs into the lungs every 6 (six) hours as needed for wheezing or shortness of breath., Disp: 6.7 g, Rfl: 0   cyclobenzaprine (FLEXERIL) 10 MG tablet, Take 1/2-1 tablet (5-10 mg total) by mouth 3 (three) times daily as needed for muscle spasms. avoid opoids & by 1-2 hours to avoid oversedation, Disp: 90 tablet, Rfl: 0    HYDROcodone-acetaminophen (NORCO) 10-325 MG tablet, Take 1 tablet by mouth 5 (five) times daily as needed for pain, Disp: 150 tablet, Rfl: 0   meloxicam (MOBIC) 15 MG tablet, Take 1 tablet (15 mg total) by mouth daily., Disp: 90 tablet, Rfl: 1   naloxone (NARCAN) nasal spray 4 mg/0.1 mL, Use 1 spray as needed, IF FOUND UNRESPONSIVE THEN SPRAY THIS INTO NOSE AND CALL 911 IMMEDIATELY, Disp: 2 each, Rfl: 3   pregabalin (LYRICA) 75 MG capsule, Take 1 capsule (75 mg total) by mouth 3 (three) times daily for neuropathy, Disp: 90 capsule, Rfl: 0   HYDROcodone-acetaminophen (NORCO) 10-325 MG tablet, Take 1 tablet by mouth 5 (five) times daily as needed for pain, Disp: 150 tablet, Rfl: 0   Subjective:   PATIENT ID: William Oliver GENDER: male DOB: 1981-03-28, MRN: [id number]  Chief Complaint  Patient presents with   Consult    Worsening shortness of breath with chest pain x 5 months. Welding/machinist for 22 years.     HPI  Patient is a 42 year old male presenting to clinic for an evaluation of shortness of breath as well as chest tightness.  He reports symptoms of chest pain that he mostly noticed on the left side of his chest anteriorly and one spot.  This is associated with shortness of breath as well as a minimal cough that is nonproductive.  The shortness of breath and the chest pain has been present for many months (at least over 6 months) and has been persistent.  There are no alleviating or exacerbating factors for the pain.  He is a  welder and feels that his symptoms of shortness of breath and fatigue are most notable whenever he is at work or exposed to metal fumes.  He does not have any fevers, chills, night sweats, or systemic signs of illness.  He has tried an inhaler that helps a bit with his shortness of breath but does not help with the pain.  The pain is localized and nonreproducible.  It is nonradiating and he does not feel it in his shoulder/neck/back.  He does not have any palpitations  associated with it.  The pain is constant and is not worsened with exertion.  He has not had any dizziness, loss of consciousness, or near syncope.  Patient is a non-smoker and denies any vaping.  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  He is also done some painting at work.  He otherwise does not have any occupational exposures.  Ancillary information including prior medications, full medical/surgical/family/social histories, and PFTs (when available) are listed below and have been reviewed.   Review of Systems  Constitutional:  Negative for chills, fever and weight loss.  Respiratory:  Positive for cough and shortness of breath.      Objective:   Vitals:   04/08/23 1517  BP: 116/86  Pulse: 67  Temp: 97.6 F (36.4 C)  SpO2: 100%  Weight: 173 lb 9.6 oz (78.7 kg)  Height: 5\' 11"  (1.803 m)   100% on RA  BMI Readings from Last 3 Encounters:  04/08/23 24.21 kg/m  03/19/23 24.24 kg/m  04/20/14 22.05 kg/m   Wt Readings from Last 3 Encounters:  04/08/23 173 lb 9.6 oz (78.7 kg)  03/19/23 173 lb 12.8 oz (78.8 kg)  04/20/14 158 lb 2 oz (71.7 kg)    Physical Exam Constitutional:      General: He is not in acute distress.    Appearance: Normal appearance. He is not ill-appearing or toxic-appearing.  HENT:     Head: Normocephalic.  Cardiovascular:     Rate and Rhythm: Normal rate and regular rhythm.     Pulses: Normal pulses.     Heart sounds: Normal heart sounds.  Pulmonary:     Effort: Pulmonary effort is normal. No respiratory distress.     Breath sounds: Normal breath sounds. No wheezing or rales.  Neurological:     General: No focal deficit present.     Mental Status: He is alert and oriented to person, place, and time. Mental status is at baseline.       Ancillary Information    Past Medical History:  Diagnosis Date   Laceration    left middle finger   MRSA (methicillin resistant Staphylococcus aureus)       Family History  Problem Relation Age of Onset   Lupus Mother    COPD Father    Heart disease Maternal Uncle    Throat cancer Father        never smoker   Lung cancer Paternal Grandfather        was a smoker   Emphysema Maternal Grandmother    Emphysema Paternal Grandfather    Clotting disorder Maternal Grandmother      Past Surgical History:  Procedure Laterality Date   HAND SURGERY     TENDON REPAIR Left 01/04/2014   Procedure: TENDON REPAIR;  Surgeon: Marlowe Shores, MD;  Location: [facility];  Service: Orthopedics;  Laterality: Left;   WISDOM TOOTH EXTRACTION      Social History  Socioeconomic History   Marital status: Single    Spouse name: Not on file   Number of children: 0   Years of education: Not on file   Highest education level: 12th grade  Occupational History   Occupation: Biomedical engineer: [employer]  Tobacco Use   Smoking status: Never    Passive exposure: Never   Smokeless tobacco: Never  Vaping Use   Vaping status: Never Used  Substance and Sexual Activity   Alcohol use: No   Drug use: No   Sexual activity: Yes  Other Topics Concern   Not on file  Social History Narrative   Not on file   Social Determinants of Health   Financial Resource Strain: Patient Declined (04/08/2023)   Overall Financial Resource Strain (CARDIA)    Difficulty of Paying Living Expenses: Patient declined  Food Insecurity: No Food Insecurity (04/08/2023)   Hunger Vital Sign    Worried About Running Out of Food in the Last Year: Never true    Ran Out of Food in the Last Year: Never true  Transportation Needs: No Transportation Needs (04/08/2023)   PRAPARE - Administrator, Civil Service (Medical): No    Lack of Transportation (Non-Medical): No  Physical Activity: Insufficiently Active (04/08/2023)   Exercise Vital Sign    Days of Exercise per Week: 3 days    Minutes of Exercise per Session: 20 min  Stress: No Stress  Concern Present (04/08/2023)   [employer] of Occupational Health - Occupational Stress Questionnaire    Feeling of Stress : Not at all  Social Connections: Moderately Isolated (04/08/2023)   Social Connection and Isolation Panel [NHANES]    Frequency of Communication with Friends and Family: More than three times a week    Frequency of Social Gatherings with Friends and Family: Three times a week    Attends Religious Services: More than 4 times per year    Active Member of Clubs or Organizations: No    Attends Banker Meetings: Not on file    Marital Status: Never married  Intimate Partner Violence: Not on file     Allergies  Allergen Reactions   Penicillins Hives and Swelling     CBC    Component Value Date/Time   WBC 6.9 04/20/2014 1712   RBC 4.89 04/20/2014 1712   HGB 15.7 04/20/2014 1712   HCT 45.6 04/20/2014 1712   PLT 237.0 04/20/2014 1712   MCV 93.3 04/20/2014 1712   MCV 98.1 (A) 06/05/2012 1001   MCH 31.4 (A) 06/05/2012 1001   MCHC 34.4 04/20/2014 1712   RDW 12.6 04/20/2014 1712    Pulmonary Functions Testing Results:     No data to display          Outpatient Medications Prior to Visit  Medication Sig Dispense Refill   albuterol (VENTOLIN HFA) 108 (90 Base) MCG/ACT inhaler Inhale 2 puffs into the lungs every 6 (six) hours as needed for wheezing or shortness of breath. 6.7 g 0   cyclobenzaprine (FLEXERIL) 10 MG tablet Take 1/2-1 tablet (5-10 mg total) by mouth 3 (three) times daily as needed for muscle spasms. avoid opoids & by 1-2 hours to avoid oversedation 90 tablet 0   HYDROcodone-acetaminophen (NORCO) 10-325 MG tablet Take 1 tablet by mouth 5 (five) times daily as needed for pain 150 tablet 0   meloxicam (MOBIC) 15 MG tablet Take 1 tablet (15 mg total) by mouth daily. 90 tablet 1   naloxone (NARCAN)  nasal spray 4 mg/0.1 mL Use 1 spray as needed, IF FOUND UNRESPONSIVE THEN SPRAY THIS INTO NOSE AND CALL 911 IMMEDIATELY 2 each 3    pregabalin (LYRICA) 75 MG capsule Take 1 capsule (75 mg total) by mouth 3 (three) times daily for neuropathy 90 capsule 0   HYDROcodone-acetaminophen (NORCO) 10-325 MG tablet Take 1 tablet by mouth 5 (five) times daily as needed for pain 150 tablet 0   No facility-administered medications prior to visit.

## 2023-04-09 ENCOUNTER — Encounter: Payer: 59 | Admitting: Family Medicine

## 2023-04-10 ENCOUNTER — Telehealth: Payer: Self-pay | Admitting: Family Medicine

## 2023-04-10 ENCOUNTER — Encounter: Payer: 59 | Admitting: Family Medicine

## 2023-04-10 NOTE — Telephone Encounter (Signed)
9.18.24 no show / no show letter sent . Pt stated due to work

## 2023-04-11 NOTE — Telephone Encounter (Signed)
1st missed visit, same day cancel due to work

## 2023-04-22 ENCOUNTER — Ambulatory Visit
Admission: RE | Admit: 2023-04-22 | Discharge: 2023-04-22 | Disposition: A | Discharge status: Home / Self Care | Payer: 59 | Source: Ambulatory Visit | Attending: Student in an Organized Health Care Education/Training Program

## 2023-04-22 ENCOUNTER — Other Ambulatory Visit (HOSPITAL_COMMUNITY): Payer: Self-pay

## 2023-04-22 DIAGNOSIS — R0602 Shortness of breath: Secondary | ICD-10-CM

## 2023-04-22 MED ORDER — HYDROCODONE-ACETAMINOPHEN 10-325 MG PO TABS
1.0000 | ORAL_TABLET | Freq: Every day | ORAL | 0 refills | Status: DC
Start: 2023-04-22 — End: 2023-12-01
  Filled 2023-04-29: qty 150, 30d supply, fill #0

## 2023-04-22 MED ORDER — PREGABALIN 75 MG PO CAPS
75.0000 mg | ORAL_CAPSULE | Freq: Three times a day (TID) | ORAL | 0 refills | Status: DC
Start: 2023-04-22 — End: 2023-05-20
  Filled 2023-04-22: qty 90, 30d supply, fill #0

## 2023-04-25 ENCOUNTER — Ambulatory Visit (HOSPITAL_COMMUNITY)
Admission: RE | Admit: 2023-04-25 | Discharge: 2023-04-25 | Disposition: A | Discharge status: Home / Self Care | Payer: 59 | Source: Ambulatory Visit | Attending: Student in an Organized Health Care Education/Training Program | Admitting: Student in an Organized Health Care Education/Training Program

## 2023-04-25 DIAGNOSIS — R0602 Shortness of breath: Secondary | ICD-10-CM | POA: Diagnosis present

## 2023-04-25 LAB — PULMONARY FUNCTION TEST
DL/VA % pred: 87 %
DL/VA: 4.05 ml/min/mmHg/L
DLCO unc % pred: 98 %
DLCO unc: 32.12 ml/min/mmHg
FEF 25-75 Post: 5.23 L/s
FEF 25-75 Pre: 4.99 L/s
FEF2575-%Change-Post: 4 %
FEF2575-%Pred-Post: 127 %
FEF2575-%Pred-Pre: 121 %
FEV1-%Change-Post: 1 %
FEV1-%Pred-Post: 116 %
FEV1-%Pred-Pre: 114 %
FEV1-Post: 5.13 L
FEV1-Pre: 5.04 L
FEV1FVC-%Change-Post: 1 %
FEV1FVC-%Pred-Pre: 101 %
FEV6-%Change-Post: 0 %
FEV6-%Pred-Post: 114 %
FEV6-%Pred-Pre: 113 %
FEV6-Post: 6.2 L
FEV6-Pre: 6.17 L
FEV6FVC-%Change-Post: 0 %
FEV6FVC-%Pred-Post: 102 %
FEV6FVC-%Pred-Pre: 101 %
FVC-%Change-Post: 0 %
FVC-%Pred-Post: 111 %
FVC-%Pred-Pre: 111 %
FVC-Post: 6.21 L
FVC-Pre: 6.2 L
Post FEV1/FVC ratio: 83 %
Post FEV6/FVC ratio: 100 %
Pre FEV1/FVC ratio: 81 %
Pre FEV6/FVC Ratio: 99 %
RV % pred: 60 %
RV: 1.16 L
TLC % pred: 102 %
TLC: 7.45 L

## 2023-04-25 MED ORDER — ALBUTEROL SULFATE (2.5 MG/3ML) 0.083% IN NEBU
2.5000 mg | INHALATION_SOLUTION | Freq: Once | RESPIRATORY_TRACT | Status: AC
  Administered 2023-04-25: 2.5 mg via RESPIRATORY_TRACT

## 2023-04-29 ENCOUNTER — Other Ambulatory Visit (HOSPITAL_COMMUNITY): Payer: Self-pay

## 2023-04-29 ENCOUNTER — Other Ambulatory Visit: Payer: Self-pay | Admitting: Family Medicine

## 2023-04-29 DIAGNOSIS — J4520 Mild intermittent asthma, uncomplicated: Secondary | ICD-10-CM

## 2023-04-29 MED ORDER — ALBUTEROL SULFATE HFA 108 (90 BASE) MCG/ACT IN AERS
2.0000 | INHALATION_SPRAY | Freq: Four times a day (QID) | RESPIRATORY_TRACT | 0 refills | Status: DC | PRN
  Filled 2023-04-29: qty 6.7, 25d supply, fill #0

## 2023-04-30 ENCOUNTER — Ambulatory Visit (INDEPENDENT_AMBULATORY_CARE_PROVIDER_SITE_OTHER): Payer: 59 | Admitting: Family Medicine

## 2023-04-30 ENCOUNTER — Encounter: Payer: Self-pay | Admitting: Family Medicine

## 2023-04-30 VITALS — BP 124/82 | HR 79 | Temp 97.8°F | Ht 71.0 in | Wt 171.0 lb

## 2023-04-30 DIAGNOSIS — R0609 Other forms of dyspnea: Secondary | ICD-10-CM

## 2023-04-30 DIAGNOSIS — Z23 Encounter for immunization: Secondary | ICD-10-CM

## 2023-04-30 DIAGNOSIS — Z0000 Encounter for general adult medical examination without abnormal findings: Secondary | ICD-10-CM | POA: Diagnosis not present

## 2023-04-30 DIAGNOSIS — M5431 Sciatica, right side: Secondary | ICD-10-CM | POA: Diagnosis not present

## 2023-04-30 LAB — COMPREHENSIVE METABOLIC PANEL
ALT: 30 U/L (ref 0–53)
AST: 28 U/L (ref 0–37)
Albumin: 4.9 g/dL (ref 3.5–5.2)
Alkaline Phosphatase: 66 U/L (ref 39–117)
BUN: 10 mg/dL (ref 6–23)
CO2: 30 meq/L (ref 19–32)
Calcium: 10.1 mg/dL (ref 8.4–10.5)
Chloride: 100 meq/L (ref 96–112)
Creatinine, Ser: 0.94 mg/dL (ref 0.40–1.50)
GFR: 101.25 mL/min (ref >60.00)
Glucose, Bld: 89 mg/dL (ref 70–99)
Potassium: 4.2 meq/L (ref 3.5–5.1)
Sodium: 138 meq/L (ref 135–145)
Total Bilirubin: 1.3 mg/dL — ABNORMAL HIGH (ref 0.2–1.2)
Total Protein: 7.9 g/dL (ref 6.0–8.3)

## 2023-04-30 LAB — URINALYSIS, ROUTINE W REFLEX MICROSCOPIC
Bilirubin Urine: NEGATIVE
Hgb urine dipstick: NEGATIVE
Ketones, ur: NEGATIVE
Leukocytes,Ua: NEGATIVE
Nitrite: NEGATIVE
RBC / HPF: NONE SEEN (ref 0–?)
Specific Gravity, Urine: 1.015 (ref 1.000–1.030)
Total Protein, Urine: NEGATIVE
Urine Glucose: NEGATIVE
Urobilinogen, UA: 0.2 (ref 0.0–1.0)
pH: 7.5 (ref 5.0–8.0)

## 2023-04-30 LAB — TSH: TSH: 0.53 u[IU]/mL (ref 0.35–5.50)

## 2023-04-30 LAB — LIPID PANEL
Cholesterol: 205 mg/dL — ABNORMAL HIGH (ref 0–200)
HDL: 55.9 mg/dL (ref >39.00)
LDL Cholesterol: 114 mg/dL — ABNORMAL HIGH (ref 0–99)
NonHDL: 148.62
Total CHOL/HDL Ratio: 4
Triglycerides: 171 mg/dL — ABNORMAL HIGH (ref 0.0–149.0)
VLDL: 34.2 mg/dL (ref 0.0–40.0)

## 2023-04-30 LAB — CBC WITH DIFFERENTIAL/PLATELET
Basophils Absolute: 0 10*3/uL (ref 0.0–0.1)
Basophils Relative: 0.6 % (ref 0.0–3.0)
Eosinophils Absolute: 0.1 10*3/uL (ref 0.0–0.7)
Eosinophils Relative: 1.6 % (ref 0.0–5.0)
HCT: 46.5 % (ref 39.0–52.0)
Hemoglobin: 16.1 g/dL (ref 13.0–17.0)
Lymphocytes Relative: 20.6 % (ref 12.0–46.0)
Lymphs Abs: 1.3 10*3/uL (ref 0.7–4.0)
MCHC: 34.6 g/dL (ref 30.0–36.0)
MCV: 92.3 fL (ref 78.0–100.0)
Monocytes Absolute: 0.6 10*3/uL (ref 0.1–1.0)
Monocytes Relative: 9.1 % (ref 3.0–12.0)
Neutro Abs: 4.3 10*3/uL (ref 1.4–7.7)
Neutrophils Relative %: 68.1 % (ref 43.0–77.0)
Platelets: 246 10*3/uL (ref 150.0–400.0)
RBC: 5.04 Mil/uL (ref 4.22–5.81)
RDW: 12.6 % (ref 11.5–15.5)
WBC: 6.3 10*3/uL (ref 4.0–10.5)

## 2023-04-30 LAB — MICROALBUMIN / CREATININE URINE RATIO
Creatinine,U: 149.9 mg/dL
Microalb Creat Ratio: 0.5 mg/g (ref 0.0–30.0)
Microalb, Ur: <0.7 mg/dL (ref 0.0–1.9)

## 2023-04-30 LAB — HEMOGLOBIN A1C: Hgb A1c MFr Bld: 5 % (ref 4.6–6.5)

## 2023-04-30 NOTE — Patient Instructions (Signed)
Follow up with your pulmonologist to review the results of your recent CT scans and breathing test. 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 Continue taking your prescribed medications for back pain (hydrocodone, cyclobenzaprine, meloxicam, and Lyrica) as needed, particularly in the evening for the muscle relaxer. Incorporate at least 30 minutes of cardio exercise most days of the week and include muscle strength training to maintain overall health. Aim for a healthy diet rich in fruits, vegetables, lean meats, and healthy fats, while limiting high-carbohydrate foods.

## 2023-04-30 NOTE — Progress Notes (Signed)
Assessment  Assessment/Plan:   Problem List Items Addressed This Visit       Nervous and Auditory   Sciatica    Continue current medications (hydrocodone, cyclobenzaprine, meloxicam, Lyrica) as needed. Monitor for any changes in pain patterns or intensity. Follow-up with pain management        Other   Dyspnea    Continue follow-up with pulmonology. Consider further exploration of alternative causes of pain, such as musculoskeletal or uncontrolled reflux      Relevant Orders   Urinalysis w microscopic + reflex cultur   TSH   Lipid panel   Hemoglobin A1c   Microalbumin / creatinine urine ratio   Urinalysis, Routine w reflex microscopic   CBC with Differential/Platelet   Comprehensive metabolic panel   Other Visit Diagnoses     Encounter for well adult exam without abnormal findings    -  Primary   Relevant Orders   Urinalysis w microscopic + reflex cultur   TSH   Lipid panel   Hemoglobin A1c   Microalbumin / creatinine urine ratio   Urinalysis, Routine w reflex microscopic   CBC with Differential/Platelet   Comprehensive metabolic panel   Immunization due       Relevant Orders   Tdap vaccine greater than or equal to 7yo IM (Completed)       There are no discontinued medications.  Patient Counseling(The following topics were reviewed and/or handout was given):  -Nutrition: Stressed importance of moderation in sodium/caffeine intake, saturated fat and cholesterol, caloric balance, sufficient intake of fresh fruits, vegetables, and fiber.  -Stressed the importance of regular exercise.   -Substance Abuse: Discussed cessation/primary prevention of tobacco, alcohol, or other drug use; driving or other dangerous activities under the influence; availability of treatment for abuse.   -Injury prevention: Discussed safety belts, safety helmets, smoke detector, smoking near bedding or upholstery.   -Sexuality: Discussed sexually transmitted diseases, partner selection,  use of condoms, avoidance of unintended pregnancy and contraceptive alternatives.   -Dental health: Discussed importance of regular tooth brushing, flossing, and dental visits.  -Health maintenance and immunizations reviewed. Please refer to Health maintenance section.  Return to care in 1 year for next preventative visit.        Subjective:  Chief complaint Encounter date: 04/30/2023  Chief Complaint  Patient presents with   Annual Exam    CPE & fasting labs   William Oliver is a 42 y.o. male who presents today for his annual comprehensive physical exam.    Subjective:  History of Present Illness: The patient is a 42 year old male here for an annual physical examination. 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  Recent workup were reported as normal. The patient reports ongoing chest pain that is exacerbated by exposure to fumes and dust at his workplace but improves when he's away from work. He was on vacation recently and reported less chest pain during that time period.  The patient has a known history of back pain, managed with hydrocodone, cyclobenzaprine, meloxicam, and Lyrica. He uses these medications as needed.  Lifestyle Diet: The patient has made significant dietary changes, primarily consuming oatmeal with fruit in the morning after being warned about the risk of heart disease. Exercise: Formerly active in competitive speed skating but has significantly reduced physical activity since COVID-19. Occasional sit-ups and push-ups.  Review of Systems  Constitutional:  Negative for chills, diaphoresis, fever,  malaise/fatigue and weight loss.  HENT:  Negative for congestion, ear discharge, ear pain and hearing loss.   Eyes:  Negative for blurred vision, double vision, photophobia, pain, discharge and redness.  Respiratory:  Positive for shortness of breath. Negative for cough,  sputum production and wheezing.   Cardiovascular:  Positive for chest pain. Negative for palpitations and leg swelling.  Gastrointestinal:  Negative for abdominal pain, blood in stool, constipation, diarrhea, heartburn, melena, nausea and vomiting.  Genitourinary:  Negative for dysuria, flank pain, frequency, hematuria and urgency.  Musculoskeletal:  Positive for back pain. Negative for myalgias.  Skin:  Negative for itching and rash.  Neurological:  Negative for dizziness, tingling, tremors, speech change, seizures, loss of consciousness, weakness and headaches.  Endo/Heme/Allergies:  Negative for polydipsia.  Psychiatric/Behavioral:  Negative for depression, hallucinations, memory loss, substance abuse and suicidal ideas. The patient does not have insomnia.   All other systems reviewed and are negative.      04/30/2023    8:26 AM 03/19/2023    3:38 PM  Depression screen PHQ 2/9  Decreased Interest 0 0  Down, Depressed, Hopeless 0 0  PHQ - 2 Score 0 0  Altered sleeping 0 0  Tired, decreased energy 0 0  Change in appetite 0 0  Feeling bad or failure about yourself  0 0  Trouble concentrating 0 0  Moving slowly or fidgety/restless 0 0  Suicidal thoughts 0 0  PHQ-9 Score 0 0  Difficult doing work/chores Not difficult at all Not difficult at all       04/30/2023    8:26 AM 03/19/2023    3:38 PM  GAD 7 : Generalized Anxiety Score  Nervous, Anxious, on Edge 0 0  Control/stop worrying 0 0  Worry too much - different things 0 0  Trouble relaxing 0 0  Restless 0 0  Easily annoyed or irritable 0 0  Afraid - awful might happen 0 0  Total GAD 7 Score 0 0  Anxiety Difficulty Not difficult at all Not difficult at all    Health Maintenance Due  Topic Date Due   HIV Screening  Never done   Hepatitis C Screening  Never done     Dental: UTD Vision: UTD  PMH:  The following were reviewed and entered/updated in epic: Past Medical History:  Diagnosis Date   Allergy 1993    Penicillin   Laceration    left middle finger   MRSA (methicillin resistant Staphylococcus aureus)     Patient Active Problem List   Diagnosis Date Noted   Reactive airway disease, mild intermittent, uncomplicated 03/19/2023   Pleurisy 03/19/2023   Sciatica 03/19/2023   Plantar fasciitis, bilateral 04/21/2014   Frequent headaches 04/21/2014   Cellulitis and abscess 01/15/2013   Dyspnea 05/11/2012   Cough 05/09/2012   Chest pain 02/26/2008   ALLERGIC RHINITIS 02/25/2008   ASTHMA 02/25/2008    Past Surgical History:  Procedure Laterality Date   HAND SURGERY     TENDON REPAIR Left 01/04/2014   Procedure: TENDON REPAIR;  Surgeon: Marlowe Shores, MD;  Location: [facility];  Service: Orthopedics;  Laterality: Left;   WISDOM TOOTH EXTRACTION      Family History  Problem Relation Age of Onset   Lupus Mother    Arthritis Mother    Cancer Mother    COPD Father    Throat cancer Father        never smoker   Cancer Father    Vision loss Father  Heart disease Maternal Uncle    Lung cancer Paternal Grandfather        was a smoker   Emphysema Paternal Grandfather    Cancer Paternal Grandfather    Emphysema Maternal Grandmother    Clotting disorder Maternal Grandmother    COPD Maternal Grandmother    Heart disease Maternal Grandmother    Kidney disease Maternal Grandfather    Cancer Paternal Grandmother    COPD Paternal Grandmother     Medications- reviewed and updated Outpatient Medications Prior to Visit  Medication Sig Dispense Refill   albuterol (VENTOLIN HFA) 108 (90 Base) MCG/ACT inhaler Inhale 2 puffs into the lungs every 6 (six) hours as needed for wheezing or shortness of breath. 6.7 g 0   cyclobenzaprine (FLEXERIL) 10 MG tablet Take 1/2-1 tablet (5-10 mg total) by mouth 3 (three) times daily as needed for muscle spasms. avoid opoids & by 1-2 hours to avoid oversedation 90 tablet 0   HYDROcodone-acetaminophen (NORCO) 10-325 MG tablet Take 1  tablet by mouth 5 (five) times daily as needed for pain 150 tablet 0   HYDROcodone-acetaminophen (NORCO) 10-325 MG tablet Take 1 tablet by mouth 5 (five) times daily as needed for pain 150 tablet 0   HYDROcodone-acetaminophen (NORCO) 10-325 MG tablet Take 1 tablet by mouth 5 (five) times daily as needed for pain. 150 tablet 0   meloxicam (MOBIC) 15 MG tablet Take 1 tablet (15 mg total) by mouth daily. 90 tablet 1   pregabalin (LYRICA) 75 MG capsule Take 1 capsule (75 mg total) by mouth 3 (three) times daily for neuropathy 90 capsule 0   pregabalin (LYRICA) 75 MG capsule Take 1 capsule (75 mg total) by mouth 3 (three) times daily for neuropathy. 90 capsule 0   naloxone (NARCAN) nasal spray 4 mg/0.1 mL Use 1 spray as needed, IF FOUND UNRESPONSIVE THEN SPRAY THIS INTO NOSE AND CALL 911 IMMEDIATELY (Patient not taking: Reported on 04/30/2023) 2 each 3   No facility-administered medications prior to visit.    Allergies  Allergen Reactions   Penicillins Hives and Swelling    Social History   Socioeconomic History   Marital status: Single    Spouse name: Not on file   Number of children: 0   Years of education: Not on file   Highest education level: 12th grade  Occupational History   Occupation: Biomedical engineer: [employer]  Tobacco Use   Smoking status: Never    Passive exposure: Never   Smokeless tobacco: Never  Vaping Use   Vaping status: Never Used  Substance and Sexual Activity   Alcohol use: No   Drug use: No   Sexual activity: Yes    Birth control/protection: Surgical  Other Topics Concern   Not on file  Social History Narrative   Not on file   Social Determinants of Health   Financial Resource Strain: Patient Declined (04/08/2023)   Overall Financial Resource Strain (CARDIA)    Difficulty of Paying Living Expenses: Patient declined  Food Insecurity: No Food Insecurity (04/08/2023)   Hunger Vital Sign    Worried About Running Out of Food in the Last  Year: Never true    Ran Out of Food in the Last Year: Never true  Transportation Needs: No Transportation Needs (04/08/2023)   PRAPARE - Administrator, Civil Service (Medical): No    Lack of Transportation (Non-Medical): No  Physical Activity: Insufficiently Active (04/08/2023)   Exercise Vital Sign    Days of Exercise  per Week: 3 days    Minutes of Exercise per Session: 20 min  Stress: No Stress Concern Present (04/08/2023)   [employer] of Occupational Health - Occupational Stress Questionnaire    Feeling of Stress : Not at all  Social Connections: Moderately Isolated (04/08/2023)   Social Connection and Isolation Panel [NHANES]    Frequency of Communication with Friends and Family: More than three times a week    Frequency of Social Gatherings with Friends and Family: Three times a week    Attends Religious Services: More than 4 times per year    Active Member of Clubs or Organizations: No    Attends Engineer, structural: Not on file    Marital Status: Never married           Objective:  Physical Exam: BP 124/82 (BP Location: Left Arm, Patient Position: Sitting, Cuff Size: Large)   Pulse 79   Temp 97.8 F (36.6 C) (Temporal)   Ht 5\' 11"  (1.803 m)   Wt 171 lb (77.6 kg)   SpO2 98%   BMI 23.85 kg/m   Body mass index is 23.85 kg/m. Wt Readings from Last 3 Encounters:  04/30/23 171 lb (77.6 kg)  04/08/23 173 lb 9.6 oz (78.7 kg)  03/19/23 173 lb 12.8 oz (78.8 kg)    Physical Exam Constitutional:      General: He is not in acute distress.    Appearance: Normal appearance. He is not ill-appearing or toxic-appearing.  HENT:     Head: Normocephalic and atraumatic.     Right Ear: Hearing, tympanic membrane, ear canal and external ear normal. There is no impacted cerumen.     Left Ear: Hearing, tympanic membrane, ear canal and external ear normal. There is no impacted cerumen.     Nose: Nose normal. No congestion.     Mouth/Throat:     Lips: No  lesions.     Mouth: Mucous membranes are moist.     Pharynx: Oropharynx is clear. No oropharyngeal exudate.  Eyes:     General: No scleral icterus.       Right eye: No discharge.        Left eye: No discharge.     Conjunctiva/sclera: Conjunctivae normal.     Pupils: Pupils are equal, round, and reactive to light.  Neck:     Thyroid: No thyroid mass, thyromegaly or thyroid tenderness.  Cardiovascular:     Rate and Rhythm: Normal rate and regular rhythm.     Pulses: Normal pulses.     Heart sounds: Normal heart sounds.  Pulmonary:     Effort: Pulmonary effort is normal. No respiratory distress.     Breath sounds: Normal breath sounds.  Abdominal:     General: Abdomen is flat. Bowel sounds are normal.     Palpations: Abdomen is soft.  Musculoskeletal:        General: Normal range of motion.     Cervical back: Normal range of motion.     Right lower leg: No edema.     Left lower leg: No edema.  Lymphadenopathy:     Cervical: No cervical adenopathy.  Skin:    General: Skin is warm and dry.     Findings: No rash.  Neurological:     General: No focal deficit present.     Mental Status: He is alert and oriented to person, place, and time. Mental status is at baseline.     Deep Tendon Reflexes:     Reflex  Scores:      Patellar reflexes are 2+ on the right side and 2+ on the left side. Psychiatric:        Mood and Affect: Mood normal.        Behavior: Behavior normal.        Thought Content: Thought content normal.        Judgment: Judgment normal.         At today's visit, we discussed treatment options, associated risk and benefits, and engage in counseling as needed.  Additionally the following were reviewed: Past medical records, past medical and surgical history, family and social background, as well as relevant laboratory results, imaging findings, and specialty notes, where applicable.  This message was generated using dictation software, and as a result, it may contain  unintentional typos or errors.  Nevertheless, extensive effort was made to accurately convey at the pertinent aspects of the patient visit.    There may have been are other unrelated non-urgent complaints, but due to the busy schedule and the amount of time already spent with him, time does not permit to address these issues at today's visit. Another appointment may have or has been requested to review these additional issues.     Thomes Dinning, MD, MS

## 2023-04-30 NOTE — Assessment & Plan Note (Addendum)
Continue follow-up with pulmonology. Consider further exploration of alternative causes of pain, such as musculoskeletal or uncontrolled reflux

## 2023-04-30 NOTE — Assessment & Plan Note (Signed)
Continue current medications (hydrocodone, cyclobenzaprine, meloxicam, Lyrica) as needed. Monitor for any changes in pain patterns or intensity. Follow-up with pain management

## 2023-05-01 LAB — URINALYSIS W MICROSCOPIC + REFLEX CULTURE
Bacteria, UA: NONE SEEN /[HPF]
Bilirubin Urine: NEGATIVE
Glucose, UA: NEGATIVE
Hgb urine dipstick: NEGATIVE
Hyaline Cast: NONE SEEN /[LPF]
Ketones, ur: NEGATIVE
Leukocyte Esterase: NEGATIVE
Nitrites, Initial: NEGATIVE
Protein, ur: NEGATIVE
RBC / HPF: NONE SEEN /[HPF] (ref 0–2)
Specific Gravity, Urine: 1.02 (ref 1.001–1.035)
Squamous Epithelial / HPF: NONE SEEN /[HPF] (ref <=5)
WBC, UA: NONE SEEN /[HPF] (ref 0–5)
pH: 8 (ref 5.0–8.0)

## 2023-05-01 LAB — NO CULTURE INDICATED

## 2023-05-06 ENCOUNTER — Encounter: Payer: Self-pay | Admitting: Student in an Organized Health Care Education/Training Program

## 2023-05-06 ENCOUNTER — Ambulatory Visit: Payer: 59 | Admitting: Student in an Organized Health Care Education/Training Program

## 2023-05-06 VITALS — BP 126/86 | HR 68 | Temp 97.8°F | Ht 71.0 in | Wt 174.0 lb

## 2023-05-06 DIAGNOSIS — R0602 Shortness of breath: Secondary | ICD-10-CM

## 2023-05-06 LAB — NITRIC OXIDE: Nitric Oxide: 19

## 2023-05-06 NOTE — Progress Notes (Signed)
Assessment & Plan:   1. Shortness of breath  William Oliver is a pleasant 42 year old male with an occupational history most notable for welding who presents with symptoms of shortness of breath and chest tightness. He feels well today and his physical exam is unremarkable and re-assuring.  Given symptoms, my differential included reactive airway disease and pneumoconiosis. I personally reviewed the high resolution chest CT that was performed and noted no evidence of pneumoconiosis. This was fully within normal without any signs of ILD as well.   Pulmonary function testing was also normal, not showing any signs of obstruction or restriction. Today, we also obtained a FeNO level which was also within normal at 19 ppb. Both results are reassuring and argue against a diagnosis of reactive airway disease. I did counsel the patient that we could order a methacholine challenge test to fully rule out asthma. I also counseled him that since he feels better when using an N-95 mask, he should consider wearing an N-99 respiratory that is OSHA approved. I doubt at this point that a methacholine challenge test would change my management aside from recommending avoidance of dust and wearing of a respiratory.  -use OSHA approved respiratory at work -FeNO at 19 ppb today  Return if symptoms worsen or fail to improve.  I spent 30 minutes caring for this patient today, including preparing to see the patient, obtaining a medical history , reviewing a separately obtained history, performing a medically appropriate examination and/or evaluation, counseling and educating the patient/family/caregiver, ordering medications, tests, or procedures, and documenting clinical information in the electronic health record and  Raechel Chute, MD [facility] 05/06/2023 4:41 PM    End of visit medications:  No orders of the defined types were placed in this encounter.    Current Outpatient Medications:     albuterol (VENTOLIN HFA) 108 (90 Base) MCG/ACT inhaler, Inhale 2 puffs into the lungs every 6 (six) hours as needed for wheezing or shortness of breath., Disp: 6.7 g, Rfl: 0   cyclobenzaprine (FLEXERIL) 10 MG tablet, Take 1/2-1 tablet (5-10 mg total) by mouth 3 (three) times daily as needed for muscle spasms. avoid opoids & by 1-2 hours to avoid oversedation, Disp: 90 tablet, Rfl: 0   HYDROcodone-acetaminophen (NORCO) 10-325 MG tablet, Take 1 tablet by mouth 5 (five) times daily as needed for pain, Disp: 150 tablet, Rfl: 0   meloxicam (MOBIC) 15 MG tablet, Take 1 tablet (15 mg total) by mouth daily., Disp: 90 tablet, Rfl: 1   naloxone (NARCAN) nasal spray 4 mg/0.1 mL, Use 1 spray as needed, IF FOUND UNRESPONSIVE THEN SPRAY THIS INTO NOSE AND CALL 911 IMMEDIATELY, Disp: 2 each, Rfl: 3   pregabalin (LYRICA) 75 MG capsule, Take 1 capsule (75 mg total) by mouth 3 (three) times daily for neuropathy., Disp: 90 capsule, Rfl: 0   HYDROcodone-acetaminophen (NORCO) 10-325 MG tablet, Take 1 tablet by mouth 5 (five) times daily as needed for pain, Disp: 150 tablet, Rfl: 0   HYDROcodone-acetaminophen (NORCO) 10-325 MG tablet, Take 1 tablet by mouth 5 (five) times daily as needed for pain., Disp: 150 tablet, Rfl: 0   Subjective:   PATIENT ID: William Oliver GENDER: male DOB: [birth year], MRN: [id number]  Chief Complaint  Patient presents with   Follow-up    No shortness of breath.     HPI  Patient is a 42 year old male presenting to clinic for follow up. I initially saw him in September for the evaluation of shortness of  breath as well as chest tightness.   Patient is asymptomatic today.  He feels well overall and has no symptoms.  No shortness of breath reported, no chest pain, no wheeze, no cough, and no chest tightness.  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  Initial history obtained during our visit in  September showed the following:  He reported symptoms of chest pain mostly on the left side of the chest anteriorly.  This is associated with shortness of breath as well as a minimal cough that is nonproductive.  The shortness of breath and the chest pain had been present for many months (at least over 6 months).  There are no alleviating or exacerbating factors for the pain.  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  He does not have any fevers, chills, night sweats, or systemic signs of illness.  He has tried an inhaler that helps a bit with his shortness of breath but does not help with the pain.  The pain is localized and nonreproducible.  It is nonradiating and he does not feel it in his shoulder/neck/back.  He does not have any palpitations associated with it.  The pain is constant and is not worsened with exertion.  He has not had any dizziness, loss of consciousness, or near syncope.   Patient is a non-smoker and denies any vaping.  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  He is also done some painting at work.  He otherwise does not have any occupational exposures.   Ancillary information including prior medications, full medical/surgical/family/social histories, and PFTs (when available) are listed below and have been reviewed.   Review of Systems  Constitutional:  Negative for chills, fever and weight loss.  Cardiovascular:  Negative for chest pain, palpitations and orthopnea.     Objective:   Vitals:   05/06/23 1535  BP: 126/86  Pulse: 68  Temp: 97.8 F (36.6 C)  TempSrc: Temporal  SpO2: 97%  Weight: 174 lb (78.9 kg)  Height: 5\' 11"  (1.803 m)   97% on RA BMI Readings from Last 3 Encounters:  05/06/23 24.27 kg/m  04/30/23 23.85 kg/m  04/08/23 24.21 kg/m   Wt Readings from Last 3 Encounters:  05/06/23 174 lb (78.9 kg)   04/30/23 171 lb (77.6 kg)  04/08/23 173 lb 9.6 oz (78.7 kg)    Physical Exam Constitutional:      General: He is not in acute distress.    Appearance: Normal appearance. He is not ill-appearing or toxic-appearing.  HENT:     Head: Normocephalic.  Cardiovascular:     Rate and Rhythm: Normal rate and regular rhythm.     Pulses: Normal pulses.     Heart sounds: Normal heart sounds.  Pulmonary:     Effort: Pulmonary effort is normal. No respiratory distress.     Breath sounds: Normal breath sounds. No wheezing or rales.  Neurological:     General: No focal deficit present.     Mental Status: He is alert and oriented to person, place, and time. Mental status is at baseline.       Ancillary Information    Past Medical History:  Diagnosis Date   Allergy 1993   Penicillin   Laceration    left middle finger  MRSA (methicillin resistant Staphylococcus aureus)      Family History  Problem Relation Age of Onset   Lupus Mother    Arthritis Mother    Cancer Mother    COPD Father    Throat cancer Father        never smoker   Cancer Father    Vision loss Father    Heart disease Maternal Uncle    Lung cancer Paternal Grandfather        was a smoker   Emphysema Paternal Grandfather    Cancer Paternal Grandfather    Emphysema Maternal Grandmother    Clotting disorder Maternal Grandmother    COPD Maternal Grandmother    Heart disease Maternal Grandmother    Kidney disease Maternal Grandfather    Cancer Paternal Grandmother    COPD Paternal Grandmother      Past Surgical History:  Procedure Laterality Date   HAND SURGERY     TENDON REPAIR Left 01/04/2014   Procedure: TENDON REPAIR;  Surgeon: Marlowe Shores, MD;  Location: [facility];  Service: Orthopedics;  Laterality: Left;   WISDOM TOOTH EXTRACTION      Social History   Socioeconomic History   Marital status: Single    Spouse name: Not on file   Number of children: 0   Years of education:  Not on file   Highest education level: 12th grade  Occupational History   Occupation: Biomedical engineer: [employer]  Tobacco Use   Smoking status: Never    Passive exposure: Never   Smokeless tobacco: Never  Vaping Use   Vaping status: Never Used  Substance and Sexual Activity   Alcohol use: No   Drug use: No   Sexual activity: Yes    Birth control/protection: Surgical  Other Topics Concern   Not on file  Social History Narrative   Not on file   Social Determinants of Health   Financial Resource Strain: Patient Declined (04/08/2023)   Overall Financial Resource Strain (CARDIA)    Difficulty of Paying Living Expenses: Patient declined  Food Insecurity: No Food Insecurity (04/08/2023)   Hunger Vital Sign    Worried About Running Out of Food in the Last Year: Never true    Ran Out of Food in the Last Year: Never true  Transportation Needs: No Transportation Needs (04/08/2023)   PRAPARE - Administrator, Civil Service (Medical): No    Lack of Transportation (Non-Medical): No  Physical Activity: Insufficiently Active (04/08/2023)   Exercise Vital Sign    Days of Exercise per Week: 3 days    Minutes of Exercise per Session: 20 min  Stress: No Stress Concern Present (04/08/2023)   [employer] of Occupational Health - Occupational Stress Questionnaire    Feeling of Stress : Not at all  Social Connections: Moderately Isolated (04/08/2023)   Social Connection and Isolation Panel [NHANES]    Frequency of Communication with Friends and Family: More than three times a week    Frequency of Social Gatherings with Friends and Family: Three times a week    Attends Religious Services: More than 4 times per year    Active Member of Clubs or Organizations: No    Attends Banker Meetings: Not on file    Marital Status: Never married  Intimate Partner Violence: Not on file     Allergies  Allergen Reactions   Penicillins Hives and Swelling      CBC    Component Value  Date/Time   WBC 6.3 04/30/2023 0906   RBC 5.04 04/30/2023 0906   HGB 16.1 04/30/2023 0906   HCT 46.5 04/30/2023 0906   PLT 246.0 04/30/2023 0906   MCV 92.3 04/30/2023 0906   MCV 98.1 (A) 06/05/2012 1001   MCH 31.4 (A) 06/05/2012 1001   MCHC 34.6 04/30/2023 0906   RDW 12.6 04/30/2023 0906   LYMPHSABS 1.3 04/30/2023 0906   MONOABS 0.6 04/30/2023 0906   EOSABS 0.1 04/30/2023 0906   BASOSABS 0.0 04/30/2023 0906    Pulmonary Functions Testing Results:    Latest Ref Rng & Units 04/25/2023    1:50 PM  PFT Results  FVC-Pre L 6.20   FVC-Predicted Pre % 111   FVC-Post L 6.21   FVC-Predicted Post % 111   Pre FEV1/FVC % % 81   Post FEV1/FCV % % 83   FEV1-Pre L 5.04   FEV1-Predicted Pre % 114   FEV1-Post L 5.13   DLCO uncorrected ml/min/mmHg 32.12   DLCO UNC% % 98   DLVA Predicted % 87   TLC L 7.45   TLC % Predicted % 102   RV % Predicted % 60     Outpatient Medications Prior to Visit  Medication Sig Dispense Refill   albuterol (VENTOLIN HFA) 108 (90 Base) MCG/ACT inhaler Inhale 2 puffs into the lungs every 6 (six) hours as needed for wheezing or shortness of breath. 6.7 g 0   cyclobenzaprine (FLEXERIL) 10 MG tablet Take 1/2-1 tablet (5-10 mg total) by mouth 3 (three) times daily as needed for muscle spasms. avoid opoids & by 1-2 hours to avoid oversedation 90 tablet 0   HYDROcodone-acetaminophen (NORCO) 10-325 MG tablet Take 1 tablet by mouth 5 (five) times daily as needed for pain 150 tablet 0   meloxicam (MOBIC) 15 MG tablet Take 1 tablet (15 mg total) by mouth daily. 90 tablet 1   naloxone (NARCAN) nasal spray 4 mg/0.1 mL Use 1 spray as needed, IF FOUND UNRESPONSIVE THEN SPRAY THIS INTO NOSE AND CALL 911 IMMEDIATELY 2 each 3   pregabalin (LYRICA) 75 MG capsule Take 1 capsule (75 mg total) by mouth 3 (three) times daily for neuropathy. 90 capsule 0   HYDROcodone-acetaminophen (NORCO) 10-325 MG tablet Take 1 tablet by mouth 5 (five) times daily as  needed for pain 150 tablet 0   HYDROcodone-acetaminophen (NORCO) 10-325 MG tablet Take 1 tablet by mouth 5 (five) times daily as needed for pain. 150 tablet 0   pregabalin (LYRICA) 75 MG capsule Take 1 capsule (75 mg total) by mouth 3 (three) times daily for neuropathy (Patient not taking: Reported on 05/06/2023) 90 capsule 0   No facility-administered medications prior to visit.

## 2023-05-20 ENCOUNTER — Other Ambulatory Visit (HOSPITAL_COMMUNITY): Payer: Self-pay

## 2023-05-20 MED ORDER — PREGABALIN 75 MG PO CAPS
75.0000 mg | ORAL_CAPSULE | Freq: Three times a day (TID) | ORAL | 0 refills | Status: DC
Start: 2023-05-20 — End: 2023-07-25
  Filled 2023-05-28: qty 90, 30d supply, fill #0

## 2023-05-20 MED ORDER — HYDROCODONE-ACETAMINOPHEN 10-325 MG PO TABS
1.0000 | ORAL_TABLET | Freq: Every day | ORAL | 0 refills | Status: DC
Start: 2023-05-20 — End: 2023-12-01
  Filled 2023-05-28: qty 150, 30d supply, fill #0

## 2023-05-28 ENCOUNTER — Other Ambulatory Visit (HOSPITAL_BASED_OUTPATIENT_CLINIC_OR_DEPARTMENT_OTHER): Payer: Self-pay

## 2023-05-28 ENCOUNTER — Other Ambulatory Visit (HOSPITAL_COMMUNITY): Payer: Self-pay

## 2023-06-19 ENCOUNTER — Other Ambulatory Visit (HOSPITAL_COMMUNITY): Payer: Self-pay

## 2023-06-19 MED ORDER — HYDROCODONE-ACETAMINOPHEN 10-325 MG PO TABS
1.0000 | ORAL_TABLET | Freq: Every day | ORAL | 0 refills | Status: DC | PRN
Start: 2023-06-19 — End: 2023-07-25
  Filled 2023-06-25: qty 150, 30d supply, fill #0

## 2023-06-25 ENCOUNTER — Other Ambulatory Visit (HOSPITAL_COMMUNITY): Payer: Self-pay

## 2023-06-25 ENCOUNTER — Other Ambulatory Visit: Payer: Self-pay

## 2023-07-25 ENCOUNTER — Other Ambulatory Visit (HOSPITAL_COMMUNITY): Payer: Self-pay

## 2023-07-25 MED ORDER — PREGABALIN 75 MG PO CAPS
75.0000 mg | ORAL_CAPSULE | Freq: Three times a day (TID) | ORAL | 0 refills | Status: DC
Start: 2023-07-23 — End: 2023-08-13
  Filled 2023-07-25: qty 90, 30d supply, fill #0

## 2023-07-25 MED ORDER — HYDROCODONE-ACETAMINOPHEN 10-325 MG PO TABS
1.0000 | ORAL_TABLET | Freq: Every day | ORAL | 0 refills | Status: DC | PRN
Start: 2023-07-23 — End: 2023-08-13
  Filled 2023-07-25: qty 150, 30d supply, fill #0

## 2023-07-25 MED ORDER — MELOXICAM 15 MG PO TABS
15.0000 mg | ORAL_TABLET | Freq: Every day | ORAL | 0 refills | Status: DC
  Filled 2023-07-25: qty 90, 90d supply, fill #0

## 2023-07-25 MED ORDER — CYCLOBENZAPRINE HCL 10 MG PO TABS
5.0000 mg | ORAL_TABLET | Freq: Three times a day (TID) | ORAL | 0 refills | Status: DC | PRN
  Filled 2023-07-25: qty 90, 30d supply, fill #0

## 2023-08-08 DIAGNOSIS — M419 Scoliosis, unspecified: Secondary | ICD-10-CM | POA: Insufficient documentation

## 2023-08-08 DIAGNOSIS — M898X9 Other specified disorders of bone, unspecified site: Secondary | ICD-10-CM

## 2023-08-08 HISTORY — DX: Other specified disorders of bone, unspecified site: M89.8X9

## 2023-08-13 ENCOUNTER — Other Ambulatory Visit (HOSPITAL_COMMUNITY): Payer: Self-pay

## 2023-08-13 MED ORDER — PREGABALIN 75 MG PO CAPS
75.0000 mg | ORAL_CAPSULE | Freq: Three times a day (TID) | ORAL | 0 refills | Status: DC
Start: 2023-08-13 — End: 2023-10-09
  Filled 2023-08-22: qty 90, 30d supply, fill #0

## 2023-08-13 MED ORDER — HYDROCODONE-ACETAMINOPHEN 10-325 MG PO TABS
1.0000 | ORAL_TABLET | Freq: Every day | ORAL | 0 refills | Status: DC | PRN
Start: 2023-08-13 — End: 2023-12-01
  Filled 2023-08-22: qty 150, 30d supply, fill #0

## 2023-08-13 MED ORDER — CYCLOBENZAPRINE HCL 10 MG PO TABS
5.0000 mg | ORAL_TABLET | Freq: Three times a day (TID) | ORAL | 0 refills | Status: AC | PRN
  Filled 2023-08-13 – 2023-08-22 (×2): qty 90, 30d supply, fill #0

## 2023-08-22 ENCOUNTER — Other Ambulatory Visit (HOSPITAL_COMMUNITY): Payer: Self-pay

## 2023-08-29 ENCOUNTER — Other Ambulatory Visit (HOSPITAL_COMMUNITY): Payer: Self-pay

## 2023-08-29 ENCOUNTER — Encounter (HOSPITAL_COMMUNITY): Payer: Self-pay

## 2023-09-10 ENCOUNTER — Other Ambulatory Visit (HOSPITAL_COMMUNITY): Payer: Self-pay

## 2023-09-10 MED ORDER — HYDROCODONE-ACETAMINOPHEN 10-325 MG PO TABS
1.0000 | ORAL_TABLET | Freq: Every day | ORAL | 0 refills | Status: DC
  Filled 2023-09-19: qty 150, 30d supply, fill #0

## 2023-09-12 ENCOUNTER — Ambulatory Visit: Payer: 59 | Admitting: Family Medicine

## 2023-09-12 ENCOUNTER — Other Ambulatory Visit: Payer: Self-pay | Admitting: Family Medicine

## 2023-09-12 ENCOUNTER — Other Ambulatory Visit (HOSPITAL_COMMUNITY)
Admission: RE | Admit: 2023-09-12 | Discharge: 2023-09-12 | Disposition: A | Discharge status: Home / Self Care | Payer: 59 | Source: Ambulatory Visit | Attending: Family Medicine | Admitting: Family Medicine

## 2023-09-12 VITALS — BP 140/88 | HR 94 | Temp 98.3°F | Resp 18 | Wt 175.6 lb

## 2023-09-12 DIAGNOSIS — Z7721 Contact with and (suspected) exposure to potentially hazardous body fluids: Secondary | ICD-10-CM | POA: Diagnosis present

## 2023-09-12 DIAGNOSIS — R300 Dysuria: Secondary | ICD-10-CM | POA: Insufficient documentation

## 2023-09-12 DIAGNOSIS — Z113 Encounter for screening for infections with a predominantly sexual mode of transmission: Secondary | ICD-10-CM | POA: Diagnosis not present

## 2023-09-12 MED ORDER — DOXYCYCLINE HYCLATE 100 MG PO TABS: 100.0000 mg | ORAL_TABLET | Freq: Two times a day (BID) | ORAL | 0 refills | Status: AC

## 2023-09-12 NOTE — Progress Notes (Signed)
 Established Patient Office Visit   Subjective:  Patient ID: William Oliver, male    DOB: 1980-08-14  Age: 43 y.o. MRN: [id number]  Chief Complaint  Patient presents with   Acute Visit    Pt is requesting STD testing; with concerns of frequent urination and discomfort. Pt was seen on 09/09/2023 for dysuria RX Bactrim was prescribed. PT C/O of right thumb pain today.     HPI Encounter Diagnoses  Name Primary?   Exposure to blood or body fluid Yes   Screen for STD (sexually transmitted disease)    Dysuria    3 to 4-day history of mild dysuria with urinary frequency.  There has been no discharge.  Recent encounter with a new sexual partner.   Review of Systems  Constitutional: Negative.  Negative for chills and fever.  HENT: Negative.    Eyes:  Negative for blurred vision, discharge and redness.  Respiratory: Negative.    Cardiovascular: Negative.   Gastrointestinal:  Negative for abdominal pain.  Genitourinary:  Positive for dysuria and frequency. Negative for hematuria and urgency.  Musculoskeletal: Negative.  Negative for myalgias.  Skin:  Negative for rash.  Neurological:  Negative for tingling, loss of consciousness and weakness.  Endo/Heme/Allergies:  Negative for polydipsia.     Current Outpatient Medications:    albuterol (VENTOLIN HFA) 108 (90 Base) MCG/ACT inhaler, Inhale 2 puffs into the lungs every 6 (six) hours as needed for wheezing or shortness of breath., Disp: 6.7 g, Rfl: 0   cyclobenzaprine (FLEXERIL) 10 MG tablet, Take 1/2-1 tablet (5-10 mg total) by mouth 3 (three) times daily as needed for muscle spasms. avoid opoids & by 1-2 hours to avoid oversedation, Disp: 90 tablet, Rfl: 0   cyclobenzaprine (FLEXERIL) 10 MG tablet, Take 1/2-1 tablet (5-10 mg total) by mouth 3 (three) times daily as needed for muscle spasms. Space opioids and muscle relaxer 1-2 hours apart to avoid oversedation., Disp: 90 tablet, Rfl: 0   doxycycline (VIBRA-TABS) 100 MG tablet, Take 1  tablet (100 mg total) by mouth 2 (two) times daily for 10 days., Disp: 20 tablet, Rfl: 0   HYDROcodone-acetaminophen (NORCO) 10-325 MG tablet, Take 1 tablet by mouth 5 (five) times daily as needed for pain, Disp: 150 tablet, Rfl: 0   HYDROcodone-acetaminophen (NORCO) 10-325 MG tablet, Take 1 tablet by mouth 5 (five) times daily as needed for pain, Disp: 150 tablet, Rfl: 0   HYDROcodone-acetaminophen (NORCO) 10-325 MG tablet, Take 1 tablet by mouth 5 (five) times daily as needed for pain., Disp: 150 tablet, Rfl: 0   HYDROcodone-acetaminophen (NORCO) 10-325 MG tablet, Take 1 tablet by mouth 5 (five) times daily as needed for pain., Disp: 150 tablet, Rfl: 0   HYDROcodone-acetaminophen (NORCO) 10-325 MG tablet, Take 1 tablet by mouth 5 (five) times daily as needed for pain., Disp: 150 tablet, Rfl: 0   HYDROcodone-acetaminophen (NORCO) 10-325 MG tablet, Take 1 tablet by mouth 5 (five) times daily as needed for pain, Disp: 150 tablet, Rfl: 0   meloxicam (MOBIC) 15 MG tablet, Take 1 tablet (15 mg total) by mouth daily., Disp: 90 tablet, Rfl: 1   meloxicam (MOBIC) 15 MG tablet, Take 1 tablet (15 mg total) by mouth daily., Disp: 90 tablet, Rfl: 0   naloxone (NARCAN) nasal spray 4 mg/0.1 mL, Use 1 spray as needed, IF FOUND UNRESPONSIVE THEN SPRAY THIS INTO NOSE AND CALL 911 IMMEDIATELY, Disp: 2 each, Rfl: 3   pregabalin (LYRICA) 75 MG capsule, Take 1 capsule (75 mg total) by  mouth 3 (three) times daily for neuropathy, Disp: 90 capsule, Rfl: 0   Objective:     BP (!) 140/88 (BP Location: Right Arm, Patient Position: Sitting, Cuff Size: Large) Comment: recheck  Pulse 94   Temp 98.3 F (36.8 C) (Temporal)   Resp 18   Wt 175 lb 9.6 oz (79.7 kg)   SpO2 100%   BMI 24.49 kg/m    Physical Exam Constitutional:      General: He is not in acute distress.    Appearance: Normal appearance. He is not ill-appearing, toxic-appearing or diaphoretic.  HENT:     Head: Normocephalic and atraumatic.     Right Ear:  External ear normal.     Left Ear: External ear normal.     Mouth/Throat:     Mouth: Mucous membranes are moist.     Pharynx: Oropharynx is clear. No oropharyngeal exudate or posterior oropharyngeal erythema.  Eyes:     General: No scleral icterus.       Right eye: No discharge.        Left eye: No discharge.     Extraocular Movements: Extraocular movements intact.     Conjunctiva/sclera: Conjunctivae normal.     Pupils: Pupils are equal, round, and reactive to light.  Cardiovascular:     Rate and Rhythm: Normal rate and regular rhythm.  Pulmonary:     Effort: Pulmonary effort is normal. No respiratory distress.     Breath sounds: Normal breath sounds.  Abdominal:     General: Bowel sounds are normal.     Tenderness: There is no abdominal tenderness. There is no right CVA tenderness, left CVA tenderness, guarding or rebound.     Hernia: There is no hernia in the left inguinal area or right inguinal area.  Genitourinary:    Penis: Circumcised. No hypospadias, erythema, tenderness, discharge, swelling or lesions.      Testes:        Right: Mass, tenderness or swelling not present.        Left: Mass, tenderness or swelling not present.     Epididymis:     Right: Not inflamed or enlarged. No mass.     Left: Not inflamed or enlarged. No mass.  Musculoskeletal:     Cervical back: No rigidity or tenderness.  Lymphadenopathy:     Lower Body: No right inguinal adenopathy. No left inguinal adenopathy.  Skin:    General: Skin is warm and dry.  Neurological:     Mental Status: He is alert and oriented to person, place, and time.  Psychiatric:        Mood and Affect: Mood normal.        Behavior: Behavior normal.      No results found for any visits on 09/12/23.    The 10-year ASCVD risk score (Arnett DK, et al., 2019) is: 1.3%    Assessment & Plan:   Exposure to blood or body fluid -     HIV Antibody (routine testing w rflx) -     RPR -     Urinalysis, Routine w reflex  microscopic -     Urine cytology ancillary only  Screen for STD (sexually transmitted disease) -     HIV Antibody (routine testing w rflx) -     RPR  Dysuria -     Urinalysis, Routine w reflex microscopic -     Urine cytology ancillary only -     Doxycycline Hyclate; Take 1 tablet (100 mg total) by mouth  2 (two) times daily for 10 days.  Dispense: 20 tablet; Refill: 0    Return Follow up with Dr. Janee Morn if not improving.Mliss Sax, MD

## 2023-09-13 ENCOUNTER — Encounter: Payer: Self-pay | Admitting: Family Medicine

## 2023-09-13 LAB — HIV ANTIBODY (ROUTINE TESTING W REFLEX): HIV 1&2 Ab, 4th Generation: NONREACTIVE

## 2023-09-13 LAB — SPECIMEN STATUS REPORT

## 2023-09-13 LAB — RPR: RPR Ser Ql: NONREACTIVE

## 2023-09-13 NOTE — Addendum Note (Signed)
 Addended by: Tonita Phoenix on: 09/13/2023 10:13 AM   Modules accepted: Orders

## 2023-09-16 ENCOUNTER — Other Ambulatory Visit (INDEPENDENT_AMBULATORY_CARE_PROVIDER_SITE_OTHER): Payer: 59

## 2023-09-16 ENCOUNTER — Other Ambulatory Visit (HOSPITAL_COMMUNITY)
Admission: RE | Admit: 2023-09-16 | Discharge: 2023-09-16 | Disposition: A | Discharge status: Home / Self Care | Payer: 59 | Source: Ambulatory Visit | Attending: Family Medicine | Admitting: Family Medicine

## 2023-09-16 DIAGNOSIS — Z7721 Contact with and (suspected) exposure to potentially hazardous body fluids: Secondary | ICD-10-CM | POA: Diagnosis present

## 2023-09-16 LAB — URINE CYTOLOGY ANCILLARY ONLY
Chlamydia: NEGATIVE
Comment: NEGATIVE
Comment: NEGATIVE
Comment: NORMAL
Neisseria Gonorrhea: NEGATIVE
Trichomonas: NEGATIVE

## 2023-09-16 NOTE — Addendum Note (Signed)
 Addended by: Andrez Grime on: 09/16/2023 08:51 AM   Modules accepted: Orders

## 2023-09-17 LAB — URINALYSIS, ROUTINE W REFLEX MICROSCOPIC
Bilirubin Urine: NEGATIVE
Hgb urine dipstick: NEGATIVE
Ketones, ur: NEGATIVE
Leukocytes,Ua: NEGATIVE
Nitrite: NEGATIVE
RBC / HPF: NONE SEEN (ref 0–?)
Specific Gravity, Urine: 1.02 (ref 1.000–1.030)
Total Protein, Urine: NEGATIVE
Urine Glucose: NEGATIVE
Urobilinogen, UA: 0.2 (ref 0.0–1.0)
WBC, UA: NONE SEEN (ref 0–?)
pH: 7 (ref 5.0–8.0)

## 2023-09-17 LAB — URINE CULTURE
MICRO NUMBER:: 16119967
Result:: NO GROWTH
SPECIMEN QUALITY:: ADEQUATE

## 2023-09-18 LAB — URINE CYTOLOGY ANCILLARY ONLY
Chlamydia: NEGATIVE
Comment: NEGATIVE
Comment: NORMAL
Neisseria Gonorrhea: NEGATIVE

## 2023-09-19 ENCOUNTER — Other Ambulatory Visit (HOSPITAL_COMMUNITY): Payer: Self-pay

## 2023-10-02 ENCOUNTER — Encounter: Payer: Self-pay | Admitting: Internal Medicine

## 2023-10-02 ENCOUNTER — Ambulatory Visit: Admitting: Internal Medicine

## 2023-10-02 ENCOUNTER — Ambulatory Visit: Payer: Self-pay | Admitting: Family Medicine

## 2023-10-02 ENCOUNTER — Ambulatory Visit (HOSPITAL_BASED_OUTPATIENT_CLINIC_OR_DEPARTMENT_OTHER)
Admission: RE | Admit: 2023-10-02 | Discharge: 2023-10-02 | Disposition: A | Discharge status: Home / Self Care | Source: Ambulatory Visit | Attending: Internal Medicine | Admitting: Internal Medicine

## 2023-10-02 ENCOUNTER — Other Ambulatory Visit (HOSPITAL_COMMUNITY)
Admission: RE | Admit: 2023-10-02 | Discharge: 2023-10-02 | Disposition: A | Discharge status: Home / Self Care | Source: Ambulatory Visit

## 2023-10-02 VITALS — BP 124/82 | HR 65 | Temp 98.1°F | Ht 71.0 in | Wt 174.4 lb

## 2023-10-02 DIAGNOSIS — N50819 Testicular pain, unspecified: Secondary | ICD-10-CM | POA: Insufficient documentation

## 2023-10-02 LAB — POC URINALSYSI DIPSTICK (AUTOMATED)
Bilirubin, UA: NEGATIVE
Blood, UA: NEGATIVE
Glucose, UA: NEGATIVE
Ketones, UA: NEGATIVE
Leukocytes, UA: NEGATIVE
Nitrite, UA: NEGATIVE
Protein, UA: NEGATIVE
Spec Grav, UA: 1.02 (ref 1.010–1.025)
Urobilinogen, UA: 0.2 U/dL
pH, UA: 6.5 (ref 5.0–8.0)

## 2023-10-02 NOTE — Telephone Encounter (Signed)
 Noted.

## 2023-10-02 NOTE — Patient Instructions (Signed)
 Go for ultrasound :  [facility]  [address]  [place], [place] [address]   Enter at Emergency Room Entrance and notify them you are here for outpatient ultrasound

## 2023-10-02 NOTE — Telephone Encounter (Signed)
 Patient seen today by Salvatore Decent. Dm/cma

## 2023-10-02 NOTE — Progress Notes (Unsigned)
 [facility] PRIMARY CARE [facility] [address] [place] [address] Dept: [phone] Dept Fax: [phone]  Acute Care Office Visit  Subjective:   William Oliver Dec 16, 1980 10/02/2023  Chief Complaint  Patient presents with   Pain    HPI: Discussed the use of AI scribe software for clinical note transcription with the patient, who gave verbal consent to proceed.  History of Present Illness   The patient presents complaining of testicular pain and urinary urgency ongoing for several weeks. He reports he was initially treated with bactrim by virtual MD which he did notice some improvement in his symptoms at that time, but then symptoms soon returned after being sexually active with male partner. He was seen by primary care on 2/24 for same CC, was given Doxycycline, which he reports no improvement of symptoms.  Despite the treatment, the patient reports ongoing intermittent testicular pain, which varies in location, but primarily in the right testicle. The pain is described as severe at times, with episodes of feeling "miserable." The patient also reports a constant urinary urgency, but denies dysuria, hematuria, or any changes in urinary volume. The patient has been tested for STDs twice, both times with negative results. The patient denies any new sexual partners since the last STD testing.  The patient also denies any fever, testicular swelling, rash, rectal pain or discomfort.       The following portions of the patient's history were reviewed and updated as appropriate: past medical history, past surgical history, family history, social history, allergies, medications, and problem list.   Patient Active Problem List   Diagnosis Date Noted   Reactive airway disease, mild intermittent, uncomplicated 03/19/2023   Pleurisy 03/19/2023   Sciatica 03/19/2023   Plantar fasciitis, bilateral 04/21/2014   Frequent headaches 04/21/2014   Cellulitis and  abscess 01/15/2013   Dyspnea 05/11/2012   Cough 05/09/2012   Chest pain 02/26/2008   ALLERGIC RHINITIS 02/25/2008   ASTHMA 02/25/2008   Past Medical History:  Diagnosis Date   Allergy 1993   Penicillin   Laceration    left middle finger   MRSA (methicillin resistant Staphylococcus aureus)    Past Surgical History:  Procedure Laterality Date   HAND SURGERY     TENDON REPAIR Left 01/04/2014   Procedure: TENDON REPAIR;  Surgeon: Marlowe Shores, MD;  Location: [facility];  Service: Orthopedics;  Laterality: Left;   WISDOM TOOTH EXTRACTION     Family History  Problem Relation Age of Onset   Lupus Mother    Arthritis Mother    Cancer Mother    COPD Father    Throat cancer Father        never smoker   Cancer Father    Vision loss Father    Heart disease Maternal Uncle    Lung cancer Paternal Grandfather        was a smoker   Emphysema Paternal Grandfather    Cancer Paternal Grandfather    Emphysema Maternal Grandmother    Clotting disorder Maternal Grandmother    COPD Maternal Grandmother    Heart disease Maternal Grandmother    Kidney disease Maternal Grandfather    Cancer Paternal Grandmother    COPD Paternal Grandmother     Current Outpatient Medications:    albuterol (VENTOLIN HFA) 108 (90 Base) MCG/ACT inhaler, Inhale 2 puffs into the lungs every 6 (six) hours as needed for wheezing or shortness of breath., Disp: 6.7 g, Rfl: 0   cyclobenzaprine (FLEXERIL) 10  MG tablet, Take 1/2-1 tablet (5-10 mg total) by mouth 3 (three) times daily as needed for muscle spasms. avoid opoids & by 1-2 hours to avoid oversedation, Disp: 90 tablet, Rfl: 0   HYDROcodone-acetaminophen (NORCO) 10-325 MG tablet, Take 1 tablet by mouth 5 (five) times daily as needed for pain, Disp: 150 tablet, Rfl: 0   meloxicam (MOBIC) 15 MG tablet, Take 1 tablet (15 mg total) by mouth daily., Disp: 90 tablet, Rfl: 0   naloxone (NARCAN) nasal spray 4 mg/0.1 mL, Use 1 spray as needed, IF  FOUND UNRESPONSIVE THEN SPRAY THIS INTO NOSE AND CALL 911 IMMEDIATELY, Disp: 2 each, Rfl: 3   pregabalin (LYRICA) 75 MG capsule, Take 1 capsule (75 mg total) by mouth 3 (three) times daily for neuropathy, Disp: 90 capsule, Rfl: 0   cyclobenzaprine (FLEXERIL) 10 MG tablet, Take 1/2-1 tablet (5-10 mg total) by mouth 3 (three) times daily as needed for muscle spasms. Space opioids and muscle relaxer 1-2 hours apart to avoid oversedation., Disp: 90 tablet, Rfl: 0   HYDROcodone-acetaminophen (NORCO) 10-325 MG tablet, Take 1 tablet by mouth 5 (five) times daily as needed for pain, Disp: 150 tablet, Rfl: 0   HYDROcodone-acetaminophen (NORCO) 10-325 MG tablet, Take 1 tablet by mouth 5 (five) times daily as needed for pain., Disp: 150 tablet, Rfl: 0   HYDROcodone-acetaminophen (NORCO) 10-325 MG tablet, Take 1 tablet by mouth 5 (five) times daily as needed for pain., Disp: 150 tablet, Rfl: 0   HYDROcodone-acetaminophen (NORCO) 10-325 MG tablet, Take 1 tablet by mouth 5 (five) times daily as needed for pain., Disp: 150 tablet, Rfl: 0   HYDROcodone-acetaminophen (NORCO) 10-325 MG tablet, Take 1 tablet by mouth 5 (five) times daily as needed for pain, Disp: 150 tablet, Rfl: 0   meloxicam (MOBIC) 15 MG tablet, Take 1 tablet (15 mg total) by mouth daily., Disp: 90 tablet, Rfl: 1 Allergies  Allergen Reactions   Penicillins Hives and Swelling     ROS: A complete ROS was performed with pertinent positives/negatives noted in the HPI. The remainder of the ROS are negative.    Objective:   Today's Vitals   10/02/23 1540  BP: 124/82  Pulse: 65  Temp: 98.1 F (36.7 C)  TempSrc: Temporal  SpO2: 98%  Weight: 174 lb 6.4 oz (79.1 kg)  Height: 5\' 11"  (1.803 m)    GENERAL: Well-appearing, in NAD. Well nourished.  SKIN: Pink, warm and dry. No rash, lesion, ulceration, or ecchymoses.  RESPIRATORY: Respirations even and non-labored.  GU: Circumcised. No penile discharge or lesions. No scrotal swelling or  discoloration. Testes descended bilaterally. Epididymis tender to right. No palpable masses. No palpable inguinal or femoral hernias.  Chaperoned by : Mary Sella CMA EXTREMITIES: Without clubbing, cyanosis, or edema.  NEUROLOGIC: No motor or sensory deficits. Steady, even gait.  PSYCH/MENTAL STATUS: Alert, oriented x 3. Cooperative, appropriate mood and affect.    Results for orders placed or performed in visit on 10/02/23  POCT Urinalysis Dipstick (Automated)  Result Value Ref Range   Color, UA yellow    Clarity, UA clear    Glucose, UA Negative Negative   Bilirubin, UA neg    Ketones, UA neg    Spec Grav, UA 1.020 1.010 - 1.025   Blood, UA neg    pH, UA 6.5 5.0 - 8.0   Protein, UA Negative Negative   Urobilinogen, UA 0.2 0.2 or 1.0 E.U./dL   Nitrite, UA neg    Leukocytes, UA Negative Negative  Assessment & Plan:  Assessment and Plan    Testicular Pain Intermittent right-sided testicular pain with severe episodes. Minimal improvement with doxycycline and Bactrim. Differential includes epididymitis or prostatitis. Negative for chlamydia, gonorrhea, and other STDs in past testing. Urinary urgency present. Discussed changing antibiotics and the need for an ultrasound for further evaluation. - Order scrotal ultrasound at [facility] at 5:30 PM. - Order urine cytology to recheck for gonorrhea, chlamydia, and trich. - Adjust treatment based on ultrasound findings.      No orders of the defined types were placed in this encounter.  Orders Placed This Encounter  Procedures   US SCROTUM W/DOPPLER    Reason for Exam (SYMPTOM  OR DIAGNOSIS REQUIRED):   Pain, evaluate for masses, abnormalities, torsion    Preferred imaging location?:   Internal    Release to patient:   Immediate    Call Results- Best Contact Number?:   [phone]   POCT Urinalysis Dipstick (Automated)   Lab Orders         POCT Urinalysis Dipstick (Automated)     No images are attached to the  encounter or orders placed in the encounter.  No follow-ups on file.   Salvatore Decent, FNP

## 2023-10-02 NOTE — Telephone Encounter (Signed)
 Chief Complaint: testicle pain Symptoms: testicle pain, worse on the R side  Frequency: intermittent for 2 wks  with recent worsening Pertinent Negatives: Patient denies fever, swelling, redness, N/V/abd pain Disposition: [x] ED /[] Urgent Care (no appt availability in office) / [] Appointment(In office/virtual)/ []  [facility]/ [] Home Care/ [x] Refused Recommended Disposition /[] [facility]/ []  Follow-up with PCP Additional Notes: Pt reports 7-8/10 testicle pain, worse on the R side. Pt states he has had testicle pain intermittently for 2 wks. Pt states he was prescribed Bactrim which he took. Pt states he was feeling better and now the pain has become worse and constant. Denies redness or swelling. Denies fever, N/V, abd pain. Does endorse urinary frequency. States he is able to urinate normally. For severe testicle pain this RN advised pt go to the ED. Pt declined and states he is busy with work today and would not have the time to do that. RN educated pt about why the ED would be the safest place to address his symptoms, pt verbalized understanding. Since the pt has already been seen for this problem and there is prior communication with the provider via MyChart messages, RN called the CAL. RN was on hold with the pt and the CAL when RN's computer shut down and the calls were dropped. RN had to reboot her computer. Then, RN called the pt back and was unable to leave a voicemail. RN called the CAL and spoke to a staff member that stated she will relay the symptoms and concerns to appropriate folks for follow-up.   RN sees communication from the provider about the pt being prescribed doxycycline. From RN's conversation with pt, pt has only taken Bactrim.    Copied from CRM [id number]. Topic: Clinical - Red Word Triage >> Oct 02, 2023  8:30 AM Fredrich Romans wrote: Red Word that prompted transfer to Nurse Triage: severe testicle pain/groin Reason for Disposition  SEVERE pain (e.g.,  excruciating)  Answer Assessment - Initial Assessment Questions 1. LOCATION and RADIATION: "Where is the pain located?"      Bilateral testicle pain, worse on the R side 2. QUALITY: "What does the pain feel like?"  (e.g., sharp, dull, aching, burning)     "All of them" 3. SEVERITY: "How bad is the pain?"  (Scale 1-10; or mild, moderate, severe)   - MILD (1-3): doesn't interfere with normal activities    - MODERATE (4-7): interferes with normal activities (e.g., work or school) or awakens from sleep   - SEVERE (8-10): excruciating pain, unable to do any normal activities, difficulty walking     7-8/10, pain is constant today, otherwise intermittent 4. ONSET: "When did the pain start?"     2 weeks, getting worse now. Was getting better 5. PATTERN: "Does it come and go, or has it been constant since it started?"     Constant 6. SCROTAL APPEARANCE: "What does the scrotum look like?" "Is there any swelling or redness?"      None 7. HERNIA: "Has a doctor ever told you that you have a hernia?"     No 8. OTHER SYMPTOMS: "Do you have any other symptoms?" (e.g., abdomen pain, difficulty passing urine, fever, vomiting)     Frequent urination, able to empty bladder. Had been taking Bactrim  Protocols used: Scrotum Pain-A-AH

## 2023-10-03 ENCOUNTER — Ambulatory Visit: Admitting: Internal Medicine

## 2023-10-03 ENCOUNTER — Encounter: Payer: Self-pay | Admitting: Internal Medicine

## 2023-10-03 ENCOUNTER — Other Ambulatory Visit: Payer: Self-pay | Admitting: Internal Medicine

## 2023-10-03 MED ORDER — LEVOFLOXACIN 500 MG PO TABS: 500.0000 mg | ORAL_TABLET | Freq: Every day | ORAL | 0 refills | Status: AC

## 2023-10-04 LAB — URINE CYTOLOGY ANCILLARY ONLY
Chlamydia: NEGATIVE
Comment: NEGATIVE
Comment: NEGATIVE
Comment: NORMAL
Neisseria Gonorrhea: NEGATIVE
Trichomonas: NEGATIVE

## 2023-10-08 ENCOUNTER — Encounter: Payer: Self-pay | Admitting: Internal Medicine

## 2023-10-09 ENCOUNTER — Other Ambulatory Visit (HOSPITAL_COMMUNITY): Payer: Self-pay

## 2023-10-09 MED ORDER — MELOXICAM 15 MG PO TABS
15.0000 mg | ORAL_TABLET | Freq: Every day | ORAL | 0 refills | Status: DC
  Filled 2023-10-16: qty 90, 90d supply, fill #0

## 2023-10-09 MED ORDER — CYCLOBENZAPRINE HCL 10 MG PO TABS
5.0000 mg | ORAL_TABLET | Freq: Three times a day (TID) | ORAL | 0 refills | Status: DC
  Filled 2023-10-09: qty 90, 30d supply, fill #0

## 2023-10-09 MED ORDER — HYDROCODONE-ACETAMINOPHEN 10-325 MG PO TABS
1.0000 | ORAL_TABLET | Freq: Every day | ORAL | 0 refills | Status: DC | PRN
  Filled 2023-10-09: qty 150, 1d supply, fill #0
  Filled 2023-10-17: qty 150, 30d supply, fill #0

## 2023-10-09 MED ORDER — PREGABALIN 75 MG PO CAPS
75.0000 mg | ORAL_CAPSULE | Freq: Three times a day (TID) | ORAL | 0 refills | Status: DC
  Filled 2023-10-09: qty 90, 30d supply, fill #0

## 2023-10-16 ENCOUNTER — Other Ambulatory Visit (HOSPITAL_COMMUNITY): Payer: Self-pay

## 2023-10-17 ENCOUNTER — Other Ambulatory Visit (HOSPITAL_COMMUNITY): Payer: Self-pay

## 2023-10-31 ENCOUNTER — Ambulatory Visit: Admitting: Family Medicine

## 2023-10-31 ENCOUNTER — Encounter: Payer: Self-pay | Admitting: Family Medicine

## 2023-10-31 VITALS — BP 128/80 | HR 79 | Temp 97.8°F | Wt 175.6 lb

## 2023-10-31 DIAGNOSIS — I7300 Raynaud's syndrome without gangrene: Secondary | ICD-10-CM | POA: Diagnosis not present

## 2023-10-31 DIAGNOSIS — M791 Myalgia, unspecified site: Secondary | ICD-10-CM

## 2023-10-31 DIAGNOSIS — K921 Melena: Secondary | ICD-10-CM

## 2023-10-31 NOTE — Progress Notes (Signed)
 Assessment/Plan:   Assessment & Plan Generalized Body Aches and Fatigue Intermittent generalized body aches and fatigue with chills, nausea, and occasional vomiting, worse in the mornings and affecting daily activities. Differential diagnosis includes autoimmune conditions (systemic lupus erythematosus, rheumatoid arthritis), infectious causes (Lyme disease, hepatitis, tuberculosis), and other conditions (endocarditis, malignancy). Family history of systemic lupus erythematosus and rheumatoid arthritis. Previous negative ANA and rheumatoid factor, but symptoms have worsened. Consider Raynaud's phenomenon due to blanching and purplish discoloration of the hands, associated with autoimmune disorders. - Order CBC, CMP, ESR, CRP, ANA with reflex, rheumatoid factor, anti-CCP, and HLA-B27. - Order hepatitis B and C titers, Lyme disease serology, and blood cultures. - Refer to rheumatology for evaluation of potential autoimmune conditions.  Raynaud's Phenomenon Episodes of blanching and purplish discoloration of the hands, seen on photo presented during clinic, particularly in response to cold or stress, suggestive of Raynaud's phenomenon, possibly linked to an underlying autoimmune condition. - Include in rheumatology referral for further evaluation and management.  Gastrointestinal Bleeding Blood in stool, possibly due to hemorrhoids, but requires evaluation to rule out inflammatory bowel disease or malignancy. Associated symptoms include diarrhea, occasional nausea, and vomiting. Referral to gastroenterology is crucial to rule out malignancy. - Order fecal calprotectin, fecal lactoferrin, and fecal occult blood test. - Refer to gastroenterology for evaluation and possible endoscopy.  Follow-up Follow-up is necessary to review lab results and specialist evaluations. Lab work was not completed during the visit due to lab closure. - Schedule follow-up appointment after lab results and specialist  consultations are completed.          Subjective:   Encounter date: 10/31/2023  William Oliver is a 43 y.o. male who has ALLERGIC RHINITIS; ASTHMA; Chest pain; Cough; Dyspnea; Cellulitis and abscess; Plantar fasciitis, bilateral; Frequent headaches; Reactive airway disease, mild intermittent, uncomplicated; Pleurisy; and Sciatica on their problem list..   He  has a past medical history of Allergy (1993), Laceration, and MRSA (methicillin resistant Staphylococcus aureus).William Oliver   He presents with chief complaint of Medical Management of Chronic Issues (Pt mom has lupus and arthritis.  Pt has been feeling body aches and chills coming and going x 6 months. Left knee pain. ) .   Discussed the use of AI scribe software for clinical note transcription with the patient, who gave verbal consent to proceed.  History of Present Illness William Oliver is a 43 year old male who presents with generalized body aches and intermittent chills.  He has been experiencing generalized body aches and intermittent chills for an extended period. The sensation is described as feeling cold yet burning simultaneously, with episodes occurring suddenly, even during conversations. These episodes can be debilitating, leaving him unable to perform daily activities, such as when he was incapacitated all day on a recent Saturday. Symptoms are often worse in the mornings, causing significant discomfort and difficulty functioning at work. Recently, the symptoms have intensified, with a particularly severe episode on a recent Sunday, followed by nausea and vomiting on Monday, leading him to leave work early. Nausea and vomiting have occurred twice, with the first episode a few weeks ago. No fever, significant weight loss, or recent travel. He has lost a few pounds, attributed to stress and decreased appetite, especially in the mornings, and forces himself to eat to take his daily medication.  He reports significant joint pain during  these episodes, with noticeable swelling in his left knee and difficulty wearing rings due to finger swelling. No significant joint deformities have been  observed, but his hands can become purple and cold, which he associates with Raynaud's phenomenon.  He experiences blood in his stool, attributed to hemorrhoids, and has not consulted a gastroenterologist. He also experiences diarrhea but denies constipation. No rashes or significant lymph node swelling have been noted.  His family history includes systemic lupus and rheumatoid arthritis in his mother, and arthritis of a different type in his mother's twin brother. He has had recent negative tests for HIV and STDs, with no history of IV drug use or exposure to HIV.     ROS  Past Surgical History:  Procedure Laterality Date   HAND SURGERY     TENDON REPAIR Left 01/04/2014   Procedure: TENDON REPAIR;  Surgeon: Hedy Living, MD;  Location: [facility];  Service: Orthopedics;  Laterality: Left;   WISDOM TOOTH EXTRACTION      Outpatient Medications Prior to Visit  Medication Sig Dispense Refill   albuterol (VENTOLIN HFA) 108 (90 Base) MCG/ACT inhaler Inhale 2 puffs into the lungs every 6 (six) hours as needed for wheezing or shortness of breath. 6.7 g 0   cyclobenzaprine (FLEXERIL) 10 MG tablet Take 1/2-1 tablet (5-10 mg total) by mouth 3 (three) times daily as needed for muscle spasms. avoid opoids & by 1-2 hours to avoid oversedation 90 tablet 0   cyclobenzaprine (FLEXERIL) 10 MG tablet Take 1/2-1 tablet (5-10 mg total) by mouth 3 (three) times daily as needed for muscle spasms. Space opioids and muscle relaxer 1-2 hours apart to avoid oversedation. 90 tablet 0   cyclobenzaprine (FLEXERIL) 10 MG tablet Take 0.5-1 tablets (5-10 mg total) by mouth 3 (three) times daily as needed for muscle spasms. Space opioids and muscle relaxer 1-2 hours apart to avoid oversedation. 90 tablet 0   HYDROcodone-acetaminophen (NORCO) 10-325 MG  tablet Take 1 tablet by mouth 5 (five) times daily as needed for pain 150 tablet 0   HYDROcodone-acetaminophen (NORCO) 10-325 MG tablet Take 1 tablet by mouth 5 (five) times daily as needed for pain 150 tablet 0   HYDROcodone-acetaminophen (NORCO) 10-325 MG tablet Take 1 tablet by mouth 5 (five) times daily as needed for pain. 150 tablet 0   HYDROcodone-acetaminophen (NORCO) 10-325 MG tablet Take 1 tablet by mouth 5 (five) times daily as needed for pain. 150 tablet 0   HYDROcodone-acetaminophen (NORCO) 10-325 MG tablet Take 1 tablet by mouth 5 (five) times daily as needed for pain. 150 tablet 0   HYDROcodone-acetaminophen (NORCO) 10-325 MG tablet Take 1 tablet by mouth 5 (five) times daily as needed for pain. 150 tablet 0   meloxicam (MOBIC) 15 MG tablet Take 1 tablet (15 mg total) by mouth daily. 90 tablet 1   meloxicam (MOBIC) 15 MG tablet Take 1 tablet (15 mg total) by mouth daily. 90 tablet 0   naloxone (NARCAN) nasal spray 4 mg/0.1 mL Use 1 spray as needed, IF FOUND UNRESPONSIVE THEN SPRAY THIS INTO NOSE AND CALL 911 IMMEDIATELY 2 each 3   pregabalin (LYRICA) 75 MG capsule Take 1 capsule (75 mg total) by mouth 3 (three) times daily for neuropathy. 90 capsule 0   No facility-administered medications prior to visit.    Family History  Problem Relation Age of Onset   Lupus Mother    Arthritis Mother    Cancer Mother    COPD Father    Throat cancer Father        never smoker   Cancer Father    Vision loss Father  Heart disease Maternal Uncle    Lung cancer Paternal Grandfather        was a smoker   Emphysema Paternal Grandfather    Cancer Paternal Grandfather    Emphysema Maternal Grandmother    Clotting disorder Maternal Grandmother    COPD Maternal Grandmother    Heart disease Maternal Grandmother    Kidney disease Maternal Grandfather    Cancer Paternal Grandmother    COPD Paternal Grandmother     Social History   Socioeconomic History   Marital status: Single     Spouse name: Not on file   Number of children: 0   Years of education: Not on file   Highest education level: 12th grade  Occupational History   Occupation: Biomedical engineer: [employer]  Tobacco Use   Smoking status: Never    Passive exposure: Never   Smokeless tobacco: Never  Vaping Use   Vaping status: Never Used  Substance and Sexual Activity   Alcohol use: No   Drug use: No   Sexual activity: Yes    Birth control/protection: Surgical  Other Topics Concern   Not on file  Social History Narrative   Not on file   Social Drivers of Health   Financial Resource Strain: Low Risk  (09/12/2023)   Overall Financial Resource Strain (CARDIA)    Difficulty of Paying Living Expenses: Not hard at all  Food Insecurity: No Food Insecurity (09/12/2023)   Hunger Vital Sign    Worried About Running Out of Food in the Last Year: Never true    Ran Out of Food in the Last Year: Never true  Transportation Needs: No Transportation Needs (09/12/2023)   PRAPARE - Administrator, Civil Service (Medical): No    Lack of Transportation (Non-Medical): No  Physical Activity: Sufficiently Active (09/12/2023)   Exercise Vital Sign    Days of Exercise per Week: 5 days    Minutes of Exercise per Session: 30 min  Stress: No Stress Concern Present (09/12/2023)   [employer] of Occupational Health - Occupational Stress Questionnaire    Feeling of Stress : Not at all  Social Connections: Moderately Isolated (09/12/2023)   Social Connection and Isolation Panel [NHANES]    Frequency of Communication with Friends and Family: More than three times a week    Frequency of Social Gatherings with Friends and Family: Three times a week    Attends Religious Services: More than 4 times per year    Active Member of Clubs or Organizations: No    Attends Engineer, structural: Not on file    Marital Status: Never married  Intimate Partner Violence: Not on file                                                                                                   Objective:  Physical Exam: BP 128/80   Pulse 79   Temp 97.8 F (36.6 C) (Temporal)   Wt 175 lb 9.6 oz (79.7 kg)   SpO2 97%   BMI 24.49 kg/m   Wt Readings from Last  3 Encounters:  10/31/23 175 lb 9.6 oz (79.7 kg)  10/02/23 174 lb 6.4 oz (79.1 kg)  09/12/23 175 lb 9.6 oz (79.7 kg)    Physical Exam GENERAL: Alert, cooperative, well developed, no acute distress. HEENT: Normocephalic, normal oropharynx, moist mucous membranes. NECK: No lymphadenopathy. CHEST: Clear to auscultation bilaterally, no wheezes, rhonchi, or crackles. CARDIOVASCULAR: Normal heart rate and rhythm, S1 and S2 normal without murmurs. ABDOMEN: Soft, non-tender, non-distended, without organomegaly, normal bowel sounds. EXTREMITIES: No cyanosis or edema. NEUROLOGICAL: Cranial nerves grossly intact, moves all extremities without gross motor or sensory deficit.     US  SCROTUM W/DOPPLER Result Date: 10/02/2023 CLINICAL DATA:  Pain.  Urinary frequency EXAM: SCROTAL ULTRASOUND DOPPLER ULTRASOUND OF THE TESTICLES TECHNIQUE: Complete ultrasound examination of the testicles, epididymis, and other scrotal structures was performed. Color and spectral Doppler ultrasound were also utilized to evaluate blood flow to the testicles. COMPARISON:  Images only from a a study of 2011. No report available FINDINGS: Right testicle Measurements: 4.6 x 2.3 x 2.8 cm. No mass or microlithiasis visualized. Left testicle Measurements: 4.4 x 2.4 x 2.9 cm. No mass or microlithiasis visualized. This subtle is differences in echogenicity compared to the right testicle are felt to be technical. Right epididymis:  Normal in size and appearance. Left epididymis:  Normal in size and appearance. Hydrocele:  Small hydrocele Varicocele:  None visualized. Pulsed Doppler interrogation of both testes demonstrates normal low resistance arterial and venous waveforms bilaterally.  IMPRESSION: Preserved testicular echotexture and blood flow on Doppler. Electronically Signed   By: Adrianna Horde M.D.   On: 10/02/2023 17:52    Recent Results (from the past 2160 hours)  Specimen status report     Status: None   Collection Time: 09/12/23 12:00 AM  Result Value Ref Range   specimen status report Comment     Comment: NTI Urine Bottle NTI Urine Bottle Comment: A urine was received with no test indicated.  If testing is required on this specimen, please contact the [facility] Client Inquiry/Technical Services Department to obtain a Request for Written Authorization Form.   Urine cytology ancillary only     Status: None   Collection Time: 09/12/23  4:10 PM  Result Value Ref Range   Neisseria Gonorrhea Negative    Chlamydia Negative    Trichomonas Negative    Comment Normal Reference Ranger Chlamydia - Negative    Comment      Normal Reference Range Neisseria Gonorrhea - Negative   Comment Normal Reference Range Trichomonas - Negative   HIV Antibody (routine testing w rflx)     Status: None   Collection Time: 09/12/23  4:20 PM  Result Value Ref Range   HIV 1&2 Ab, 4th Generation NON-REACTIVE NON-REACTIVE    Comment: HIV-1 antigen and HIV-1/HIV-2 antibodies were not detected. There is no laboratory evidence of HIV infection. Drenda Sobecki Oliver PLEASE NOTE: This information has been disclosed to you from records whose confidentiality may be protected by state law.  If your state requires such protection, then the state law prohibits you from making any further disclosure of the information without the specific written consent of the person to whom it pertains, or as otherwise permitted by law. A general authorization for the release of medical or other information is NOT sufficient for this purpose. . For additional information please refer to http://education.questdiagnostics.com/faq/FAQ106 (This link is being provided for informational/ educational purposes only.) . Cailynn Bodnar Oliver The  performance of this assay has not been clinically validated in patients less than 2 years  old. .   RPR     Status: None   Collection Time: 09/12/23  4:20 PM  Result Value Ref Range   RPR Ser Ql NON-REACTIVE NON-REACTIVE    Comment: . No laboratory evidence of syphilis. If recent exposure is suspected, submit a new sample in 2-4 weeks. .   Urine Culture     Status: None   Collection Time: 09/16/23  3:50 PM   Specimen: Urine  Result Value Ref Range   MICRO NUMBER: [id number]    SPECIMEN QUALITY: Adequate    Sample Source URINE    STATUS: FINAL    Result: No Growth   Urinalysis, Routine w reflex microscopic     Status: None   Collection Time: 09/16/23  3:50 PM  Result Value Ref Range   Color, Urine YELLOW Yellow;Lt. Yellow;Straw;Dark Yellow;Amber;Green;Red;Brown   APPearance CLEAR Clear;Turbid;Slightly Cloudy;Cloudy   Specific Gravity, Urine 1.020 1.000 - 1.030   pH 7.0 5.0 - 8.0   Total Protein, Urine NEGATIVE Negative   Urine Glucose NEGATIVE Negative   Ketones, ur NEGATIVE Negative   Bilirubin Urine NEGATIVE Negative   Hgb urine dipstick NEGATIVE Negative   Urobilinogen, UA 0.2 0.0 - 1.0   Leukocytes,Ua NEGATIVE Negative   Nitrite NEGATIVE Negative   WBC, UA none seen 0-2/hpf   RBC / HPF none seen 0-2/hpf  Urine cytology ancillary only     Status: None   Collection Time: 09/16/23  3:50 PM  Result Value Ref Range   Neisseria Gonorrhea Negative    Chlamydia Negative    Comment Normal Reference Ranger Chlamydia - Negative    Comment      Normal Reference Range Neisseria Gonorrhea - Negative  Urine cytology ancillary only     Status: None   Collection Time: 10/02/23  4:28 PM  Result Value Ref Range   Neisseria Gonorrhea Negative    Chlamydia Negative    Trichomonas Negative    Comment Normal Reference Range Trichomonas - Negative    Comment Normal Reference Ranger Chlamydia - Negative    Comment      Normal Reference Range Neisseria Gonorrhea - Negative  POCT  Urinalysis Dipstick (Automated)     Status: Normal   Collection Time: 10/02/23  4:37 PM  Result Value Ref Range   Color, UA yellow    Clarity, UA clear    Glucose, UA Negative Negative   Bilirubin, UA neg    Ketones, UA neg    Spec Grav, UA 1.020 1.010 - 1.025   Blood, UA neg    pH, UA 6.5 5.0 - 8.0   Protein, UA Negative Negative   Urobilinogen, UA 0.2 0.2 or 1.0 E.U./dL   Nitrite, UA neg    Leukocytes, UA Negative Negative        Carnell Christian, MD, MS

## 2023-11-01 ENCOUNTER — Other Ambulatory Visit

## 2023-11-01 DIAGNOSIS — K921 Melena: Secondary | ICD-10-CM

## 2023-11-02 LAB — COMPREHENSIVE METABOLIC PANEL WITH GFR
AG Ratio: 1.9 (calc) (ref 1.0–2.5)
ALT: 27 U/L (ref 9–46)
AST: 23 U/L (ref 10–40)
Albumin: 4.6 g/dL (ref 3.6–5.1)
Alkaline phosphatase (APISO): 64 U/L (ref 36–130)
BUN: 12 mg/dL (ref 7–25)
CO2: 29 mmol/L (ref 20–32)
Calcium: 9.3 mg/dL (ref 8.6–10.3)
Chloride: 102 mmol/L (ref 98–110)
Creat: 0.88 mg/dL (ref 0.60–1.29)
Globulin: 2.4 g/dL (ref 1.9–3.7)
Glucose, Bld: 84 mg/dL (ref 65–99)
Potassium: 4 mmol/L (ref 3.5–5.3)
Sodium: 139 mmol/L (ref 135–146)
Total Bilirubin: 0.7 mg/dL (ref 0.2–1.2)
Total Protein: 7 g/dL (ref 6.1–8.1)
eGFR: 111 mL/min/{1.73_m2} (ref >=60)

## 2023-11-02 LAB — CBC WITH DIFFERENTIAL/PLATELET
Absolute Lymphocytes: 2025 {cells}/uL (ref 850–3900)
Absolute Monocytes: 325 {cells}/uL (ref 200–950)
Basophils Absolute: 40 {cells}/uL (ref 0–200)
Basophils Relative: 0.8 %
Eosinophils Absolute: 110 {cells}/uL (ref 15–500)
Eosinophils Relative: 2.2 %
HCT: 41.9 % (ref 38.5–50.0)
Hemoglobin: 14.4 g/dL (ref 13.2–17.1)
MCH: 32.4 pg (ref 27.0–33.0)
MCHC: 34.4 g/dL (ref 32.0–36.0)
MCV: 94.2 fL (ref 80.0–100.0)
MPV: 9.7 fL (ref 7.5–12.5)
Monocytes Relative: 6.5 %
Neutro Abs: 2500 {cells}/uL (ref 1500–7800)
Neutrophils Relative %: 50 %
Platelets: 257 10*3/uL (ref 140–400)
RBC: 4.45 10*6/uL (ref 4.20–5.80)
RDW: 12.2 % (ref 11.0–15.0)
Total Lymphocyte: 40.5 %
WBC: 5 10*3/uL (ref 3.8–10.8)

## 2023-11-02 LAB — C-REACTIVE PROTEIN: CRP: <3 mg/L (ref <8.0)

## 2023-11-02 LAB — SEDIMENTATION RATE: Sed Rate: 2 mm/h (ref 0–15)

## 2023-11-04 ENCOUNTER — Other Ambulatory Visit: Payer: Self-pay

## 2023-11-04 DIAGNOSIS — M791 Myalgia, unspecified site: Secondary | ICD-10-CM

## 2023-11-04 DIAGNOSIS — K921 Melena: Secondary | ICD-10-CM

## 2023-11-05 LAB — HCV AB W REFLEX TO QUANT PCR: HCV Ab: NONREACTIVE

## 2023-11-05 LAB — LYME DISEASE SEROLOGY W/REFLEX

## 2023-11-05 LAB — ANA W/REFLEX: Anti Nuclear Antibody (ANA): NEGATIVE

## 2023-11-05 LAB — HCV INTERPRETATION

## 2023-11-06 ENCOUNTER — Encounter: Payer: Self-pay | Admitting: Family Medicine

## 2023-11-06 LAB — CULTURE, BLOOD (SINGLE)

## 2023-11-06 LAB — HEPATITIS B SURFACE ANTIBODY,QUALITATIVE: Hep B S Ab: REACTIVE — AB

## 2023-11-06 LAB — QUANTIFERON-TB GOLD PLUS
Mitogen-NIL: 9.05 [IU]/mL
NIL: 0.01 [IU]/mL
QuantiFERON-TB Gold Plus: NEGATIVE
TB1-NIL: 0.09 [IU]/mL
TB2-NIL: 0.12 [IU]/mL

## 2023-11-06 LAB — HLA-B27 ANTIGEN: HLA-B27 Antigen: NEGATIVE

## 2023-11-06 LAB — RHEUMATOID FACTOR: Rheumatoid fact SerPl-aCnc: <10 [IU]/mL (ref <14)

## 2023-11-06 LAB — HEPATITIS B CORE ANTIBODY, TOTAL: Hep B Core Total Ab: NONREACTIVE

## 2023-11-06 LAB — CYCLIC CITRUL PEPTIDE ANTIBODY, IGG: Cyclic Citrullin Peptide Ab: <16 U

## 2023-11-06 LAB — HEPATITIS B SURFACE ANTIGEN: Hepatitis B Surface Ag: NONREACTIVE

## 2023-11-07 ENCOUNTER — Telehealth: Payer: Self-pay

## 2023-11-07 DIAGNOSIS — M791 Myalgia, unspecified site: Secondary | ICD-10-CM

## 2023-11-07 DIAGNOSIS — I7300 Raynaud's syndrome without gangrene: Secondary | ICD-10-CM

## 2023-11-07 NOTE — Telephone Encounter (Signed)
 Blood culture was not completed due to supplies not in stock in office. Lab ordered blood cultures and they arrived today. Would you like for patient to return for blood draw?

## 2023-11-11 ENCOUNTER — Other Ambulatory Visit (HOSPITAL_COMMUNITY): Payer: Self-pay

## 2023-11-11 MED ORDER — MELOXICAM 15 MG PO TABS
15.0000 mg | ORAL_TABLET | Freq: Every day | ORAL | 0 refills | Status: AC
  Filled 2023-11-11: qty 90, 90d supply, fill #0

## 2023-11-11 MED ORDER — PREGABALIN 75 MG PO CAPS
75.0000 mg | ORAL_CAPSULE | Freq: Three times a day (TID) | ORAL | 0 refills | Status: DC
  Filled 2023-11-15: qty 90, 30d supply, fill #0

## 2023-11-11 MED ORDER — HYDROCODONE-ACETAMINOPHEN 10-325 MG PO TABS
1.0000 | ORAL_TABLET | Freq: Every day | ORAL | 0 refills | Status: DC
  Filled 2023-11-15: qty 150, 30d supply, fill #0

## 2023-11-11 MED ORDER — NALOXONE HCL 4 MG/0.1ML NA LIQD
NASAL | 3 refills | Status: AC
Start: 2023-11-11 — End: ?

## 2023-11-11 NOTE — Addendum Note (Signed)
 Addended by: Alejos Husband on: 11/11/2023 08:23 AM   Modules accepted: Orders

## 2023-11-11 NOTE — Telephone Encounter (Signed)
 Blood culture ordered and placed as future. Pt notified via mychart.

## 2023-11-15 ENCOUNTER — Other Ambulatory Visit (HOSPITAL_COMMUNITY): Payer: Self-pay

## 2023-11-19 ENCOUNTER — Ambulatory Visit: Payer: Self-pay | Admitting: *Deleted

## 2023-11-19 ENCOUNTER — Other Ambulatory Visit

## 2023-11-19 ENCOUNTER — Ambulatory Visit: Admitting: Family Medicine

## 2023-11-19 ENCOUNTER — Other Ambulatory Visit (INDEPENDENT_AMBULATORY_CARE_PROVIDER_SITE_OTHER)

## 2023-11-19 ENCOUNTER — Encounter: Payer: Self-pay | Admitting: Family Medicine

## 2023-11-19 VITALS — BP 126/72 | HR 74 | Temp 97.5°F | Wt 172.4 lb

## 2023-11-19 DIAGNOSIS — M25562 Pain in left knee: Secondary | ICD-10-CM

## 2023-11-19 DIAGNOSIS — G8929 Other chronic pain: Secondary | ICD-10-CM | POA: Diagnosis not present

## 2023-11-19 DIAGNOSIS — K921 Melena: Secondary | ICD-10-CM | POA: Diagnosis not present

## 2023-11-19 DIAGNOSIS — I7300 Raynaud's syndrome without gangrene: Secondary | ICD-10-CM

## 2023-11-19 DIAGNOSIS — M791 Myalgia, unspecified site: Secondary | ICD-10-CM

## 2023-11-19 MED ORDER — METHYLPREDNISOLONE SODIUM SUCC 125 MG IJ SOLR
125.0000 mg | Freq: Once | INTRAMUSCULAR | Status: AC
  Administered 2023-11-19: 125 mg via INTRAMUSCULAR

## 2023-11-19 NOTE — Telephone Encounter (Signed)
  Chief Complaint: requesting referral for knee pain, earlier rheumatology.  Body aches worsening  Symptoms: body aches all over worsening happening more often. Hydrocodone  not effective for body aches. Knee pain reported wants cortisone inj. Chills at times. Body feels tight. Burning sensations not sleeping, trouble concentrating.  Frequency: on going for months but past few days worsening Pertinent Negatives: Patient denies chest pain no difficulty breathing no fever Disposition: [] ED /[] Urgent Care (no appt availability in office) / [] Appointment(In office/virtual)/ []  [facility]/ [] Home Care/ [x] Refused Recommended Disposition /[] [facility]/ []  Follow-up with PCP Additional Notes:   Offered appt with PCP. Patient reports he has been seen for sx before and does not feel PCP can do more . Now awaiting rheumatoid appt and not until Oct.  Requesting referal for ortho and NT recommended Ortho at [facility]. Patient reports he will just come in today and get lab work completed. Please advise.     Copied from CRM [id number]. Topic: Clinical - Red Word Triage >> Nov 19, 2023 12:50 PM William Oliver wrote: [facility] that prompted transfer to Nurse Triage: chills, pain in body , feeling horrible Reason for Disposition  [1] SEVERE pain (e.g., excruciating, unable to do any normal activities) AND [2] not improved 2 hours after pain medicine  Answer Assessment - Initial Assessment Questions 1. ONSET: "When did the muscle aches or body pains start?"      Few months but worsening past few days  2. LOCATION: "What part of your body is hurting?" (e.g., entire body, arms, legs)      Entire body 3. SEVERITY: "How bad is the pain?" (Scale 1-10; or mild, moderate, severe)   - MILD (1-3): doesn't interfere with normal activities    - MODERATE (4-7): interferes with normal activities or awakens from sleep    - SEVERE (8-10):  excruciating pain, unable to do any normal activities      Pain level 8/10  4. CAUSE: "What do you think is causing the pains?"     Not sure  5. FEVER: "Have you been having fever?"     no 6. OTHER SYMPTOMS: "Do you have any other symptoms?" (e.g., chest pain, weakness, rash, cold or flu symptoms, weight loss)     Body ache burning body feels tight , knee pain , trouble concentrating, not sleeping, hard to work 7. PREGNANCY: "Is there any chance you are pregnant?" "When was your last menstrual period?"     na 8. TRAVEL: "Have you traveled out of the country in the last month?" (e.g., travel history, exposures)     na  Protocols used: Muscle Aches and Body Pain-A-AH

## 2023-11-19 NOTE — Telephone Encounter (Signed)
 I called and spoke with patient and scheduled him for today at 320pm to see Dr. Hildy Lowers.

## 2023-11-19 NOTE — Addendum Note (Signed)
 Addended by: Alejos Husband on: 11/19/2023 01:57 PM   Modules accepted: Orders

## 2023-11-19 NOTE — Progress Notes (Signed)
 Updated Blood culture to [facility] per lab.

## 2023-11-19 NOTE — Patient Instructions (Addendum)
 Same Day Walk [facility] Clinic [facility] at [facility] [address] [place],  [place]  [address] Main: [phone]   VISIT SUMMARY: During your visit, we discussed your ongoing symptoms, including muscle aches, blood in your stool, and left knee pain. We reviewed your recent test results and planned further evaluations and treatments to address your concerns.  YOUR PLAN: -BLOOD IN STOOL (HEMATOCHEZIA): Hematochezia means having blood in your stool. We need to complete the fecal occult blood test, calprotectin, and fecal lactoferrin tests to understand the cause. You will also be referred to a gastroenterologist for further evaluation.  -CHRONIC LEFT KNEE PAIN: Your chronic left knee pain has worsened, with increased swelling and tenderness. We will refer you to an orthopedic specialist for further evaluation. In the meantime, you received a steroid injection to help reduce inflammation, and we will test your uric acid levels to check for gout.  -MYALGIAS: Myalgias refer to muscle aches. Despite extensive testing, the cause of your muscle aches is still unclear. You have a rheumatology appointment scheduled for October to explore the possibility of an autoimmune disease. We will also assist you with FMLA paperwork to manage your work absences due to symptom flares.  INSTRUCTIONS: Please complete the fecal occult blood test, calprotectin, and fecal lactoferrin tests as instructed. Follow up with the gastroenterologist for your hematochezia evaluation. Schedule an appointment with [facility] for your knee pain. Attend your rheumatology appointment in October. Contact our office if you need assistance with FMLA paperwork.                      Contains text generated by [employer]bridge.                                 Contains text generated by Abridge.

## 2023-11-19 NOTE — Progress Notes (Signed)
 Assessment/Plan:    Assessment & Plan Blood in stool (hematochezia) Hematochezia persists. Fecal occult blood tests, calprotectin, and fecal lactoferrin were not completed. Referred to gastroenterology for further evaluation. - Refer to gastroenterology for hematochezia evaluation - Provide instructions for fecal occult blood test, calprotectin, and fecal lactoferrin tests  Chronic left knee pain Chronic left knee pain with increased puffiness and tenderness. Previous steroid injections. Possible fluid accumulation. Referral to orthopedics for further evaluation. Consideration of gout due to red and swollen knee. He prefers to wait for orthopedic evaluation before further imaging to avoid redundancy. - Refer to [facility] for evaluation - Administer methylprednisolone injection 125 mg intramuscularly - Order uric acid test to evaluate for gout  Myalgias Persistent myalgias with episodes of chills and malaise. Extensive workup including hepatitis B, hepatitis C, and autoimmune markers were negative. Rheumatology appointment scheduled for October. Consideration of autoimmune disease despite negative tests. Cold exposure noted as a trigger. - Follow up with rheumatology in October - Assist with FMLA paperwork for intermittent leave due to flares      There are no discontinued medications.  No follow-ups on file.    Subjective:   Encounter date: 11/19/2023  William Oliver is a 43 y.o. male who has ALLERGIC RHINITIS; ASTHMA; Chest pain; Cough; Dyspnea; Cellulitis and abscess; Plantar fasciitis, bilateral; Frequent headaches; Reactive airway disease, mild intermittent, uncomplicated; Pleurisy; and Sciatica on their problem list..   He  has a past medical history of Allergy (1993), Laceration, and MRSA (methicillin resistant Staphylococcus aureus).William Oliver   He presents with chief complaint of Knee Pain (Referral to ortho for left knee pain ) .   Discussed the use of AI  scribe software for clinical note transcription with the patient, who gave verbal consent to proceed.  History of Present Illness William Oliver is a 43 year old male who presents for follow-up of myalgias and blood in stool.  Since his last visit on October 31, 2023, he has experienced worsening generalized aches, chills, and episodes of fatigue and difficulty concentrating. He notes having 'four good days' before symptoms worsen again, particularly on Thursdays and Fridays, leading to missed work. Cold exposure exacerbates his symptoms, causing piloerection and a sensation of tightness in his body.  He continues to experience hematochezia but has not yet completed the fecal occult blood tests, calprotectin, and fecal lactoferrin tests. A comprehensive workup for his symptoms, including tests for hepatitis B, hepatitis C, psoriasis, Quantiferon gold, anti-CCP, rheumatoid factor, HLA B27, Lyme disease, blood culture, ANA with reflex, CRP, CBC, CMP, and ESR, were all negative or within normal limits.  He reports chronic left knee pain, which has worsened, becoming edematous and tender. He finds it difficult to maintain the knee in one position for extended periods. He recalls a previous steroid injection that was effective, but this is the first occurrence of fluid accumulation in the knee.  He has a history of chest pain, which has not recurred recently. He is concerned about his ability to work due to his symptoms and is considering FMLA to manage potential absences.  No recent chest pain. He experiences piloerection and a sensation of tightness in his body, particularly when exposed to cold.       Past Surgical History:  Procedure Laterality Date   HAND SURGERY     TENDON REPAIR Left 01/04/2014   Procedure: TENDON REPAIR;  Surgeon: Hedy Living, MD;  Location: [facility];  Service: Orthopedics;  Laterality: Left;  WISDOM TOOTH EXTRACTION      Outpatient Medications  Prior to Visit  Medication Sig Dispense Refill   albuterol  (VENTOLIN  HFA) 108 (90 Base) MCG/ACT inhaler Inhale 2 puffs into the lungs every 6 (six) hours as needed for wheezing or shortness of breath. 6.7 g 0   cyclobenzaprine  (FLEXERIL ) 10 MG tablet Take 1/2-1 tablet (5-10 mg total) by mouth 3 (three) times daily as needed for muscle spasms. avoid opoids & by 1-2 hours to avoid oversedation 90 tablet 0   cyclobenzaprine  (FLEXERIL ) 10 MG tablet Take 1/2-1 tablet (5-10 mg total) by mouth 3 (three) times daily as needed for muscle spasms. Space opioids and muscle relaxer 1-2 hours apart to avoid oversedation. 90 tablet 0   cyclobenzaprine  (FLEXERIL ) 10 MG tablet Take 0.5-1 tablets (5-10 mg total) by mouth 3 (three) times daily as needed for muscle spasms. Space opioids and muscle relaxer 1-2 hours apart to avoid oversedation. 90 tablet 0   HYDROcodone -acetaminophen  (NORCO) 10-325 MG tablet Take 1 tablet by mouth 5 (five) times daily as needed for pain 150 tablet 0   HYDROcodone -acetaminophen  (NORCO) 10-325 MG tablet Take 1 tablet by mouth 5 (five) times daily as needed for pain 150 tablet 0   HYDROcodone -acetaminophen  (NORCO) 10-325 MG tablet Take 1 tablet by mouth 5 (five) times daily as needed for pain. 150 tablet 0   HYDROcodone -acetaminophen  (NORCO) 10-325 MG tablet Take 1 tablet by mouth 5 (five) times daily as needed for pain. 150 tablet 0   HYDROcodone -acetaminophen  (NORCO) 10-325 MG tablet Take 1 tablet by mouth 5 (five) times daily as needed for pain. 150 tablet 0   HYDROcodone -acetaminophen  (NORCO) 10-325 MG tablet Take 1 tablet by mouth 5 (five) times daily as needed for pain. 150 tablet 0   HYDROcodone -acetaminophen  (NORCO) 10-325 MG tablet Take 1 tablet by mouth 5 (five) times daily as needed for pain. 150 tablet 0   meloxicam  (MOBIC ) 15 MG tablet Take 1 tablet (15 mg total) by mouth daily. 90 tablet 1   meloxicam  (MOBIC ) 15 MG tablet 1 tab by mouth daily 90 tablet 0   naloxone  (NARCAN )  nasal spray 4 mg/0.1 mL Use 1 spray as needed, IF FOUND UNRESPONSIVE THEN SPRAY THIS INTO NOSE AND CALL 911 IMMEDIATELY 2 each 3   naloxone  (NARCAN ) nasal spray 4 mg/0.1 mL 1 (one) Spray as needed, IF FOUND UNRESPONSIVE THEN SPRAY THIS INTO NOSE AND CALL 911 IMMEDIATELY 1 each 3   pregabalin  (LYRICA ) 75 MG capsule Take 1 capsule (75 mg total) by mouth 3 (three) times daily for neuropathy 90 capsule 0   No facility-administered medications prior to visit.    Family History  Problem Relation Age of Onset   Lupus Mother    Arthritis Mother    Cancer Mother    COPD Father    Throat cancer Father        never smoker   Cancer Father    Vision loss Father    Heart disease Maternal Uncle    Lung cancer Paternal Grandfather        was a smoker   Emphysema Paternal Grandfather    Cancer Paternal Grandfather    Emphysema Maternal Grandmother    Clotting disorder Maternal Grandmother    COPD Maternal Grandmother    Heart disease Maternal Grandmother    Kidney disease Maternal Grandfather    Cancer Paternal Grandmother    COPD Paternal Grandmother     Social History   Socioeconomic History   Marital status: Single  Spouse name: Not on file   Number of children: 0   Years of education: Not on file   Highest education level: 12th grade  Occupational History   Occupation: Welder/Machinist    Employer: [employer]  Tobacco Use   Smoking status: Never    Passive exposure: Never   Smokeless tobacco: Never  Vaping Use   Vaping status: Never Used  Substance and Sexual Activity   Alcohol use: No   Drug use: No   Sexual activity: Yes    Birth control/protection: Surgical  Other Topics Concern   Not on file  Social History Narrative   Not on file   Social Drivers of Health   Financial Resource Strain: Low Risk  (09/12/2023)   Overall Financial Resource Strain (CARDIA)    Difficulty of Paying Living Expenses: Not hard at all  Food Insecurity: No Food Insecurity (09/12/2023)    Hunger Vital Sign    Worried About Running Out of Food in the Last Year: Never true    Ran Out of Food in the Last Year: Never true  Transportation Needs: No Transportation Needs (09/12/2023)   PRAPARE - Administrator, Civil Service (Medical): No    Lack of Transportation (Non-Medical): No  Physical Activity: Sufficiently Active (09/12/2023)   Exercise Vital Sign    Days of Exercise per Week: 5 days    Minutes of Exercise per Session: 30 min  Stress: No Stress Concern Present (09/12/2023)   [employer] of Occupational Health - Occupational Stress Questionnaire    Feeling of Stress : Not at all  Social Connections: Moderately Isolated (09/12/2023)   Social Connection and Isolation Panel [NHANES]    Frequency of Communication with Friends and Family: More than three times a week    Frequency of Social Gatherings with Friends and Family: Three times a week    Attends Religious Services: More than 4 times per year    Active Member of Clubs or Organizations: No    Attends Engineer, structural: Not on file    Marital Status: Never married  Intimate Partner Violence: Not on file                                                                                                  Objective:  Physical Exam: BP 126/72   Pulse 74   Temp (!) 97.5 F (36.4 C) (Temporal)   Wt 172 lb 6.4 oz (78.2 kg)   SpO2 99%   BMI 24.04 kg/m   Wt Readings from Last 3 Encounters:  11/19/23 172 lb 6.4 oz (78.2 kg)  10/31/23 175 lb 9.6 oz (79.7 kg)  10/02/23 174 lb 6.4 oz (79.1 kg)    Physical Exam GENERAL: Alert, cooperative, well developed, no acute distress. HEENT: Normocephalic, normal oropharynx, moist mucous membranes. CHEST: Clear to auscultation bilaterally, no wheezes, rhonchi, or crackles. CARDIOVASCULAR: Normal heart rate and rhythm, S1 and S2 normal without murmurs. ABDOMEN: Soft, non-tender, non-distended, without organomegaly, normal bowel sounds. EXTREMITIES: No  cyanosis or edema. MUSCULOSKELETAL: Left knee red and swollen, tender on palpation. NEUROLOGICAL: Cranial nerves  grossly intact, moves all extremities without gross motor or sensory deficit.     US  SCROTUM W/DOPPLER Result Date: 10/02/2023 CLINICAL DATA:  Pain.  Urinary frequency EXAM: SCROTAL ULTRASOUND DOPPLER ULTRASOUND OF THE TESTICLES TECHNIQUE: Complete ultrasound examination of the testicles, epididymis, and other scrotal structures was performed. Color and spectral Doppler ultrasound were also utilized to evaluate blood flow to the testicles. COMPARISON:  Images only from a a study of 2011. No report available FINDINGS: Right testicle Measurements: 4.6 x 2.3 x 2.8 cm. No mass or microlithiasis visualized. Left testicle Measurements: 4.4 x 2.4 x 2.9 cm. No mass or microlithiasis visualized. This subtle is differences in echogenicity compared to the right testicle are felt to be technical. Right epididymis:  Normal in size and appearance. Left epididymis:  Normal in size and appearance. Hydrocele:  Small hydrocele Varicocele:  None visualized. Pulsed Doppler interrogation of both testes demonstrates normal low resistance arterial and venous waveforms bilaterally. IMPRESSION: Preserved testicular echotexture and blood flow on Doppler. Electronically Signed   By: Adrianna Horde M.D.   On: 10/02/2023 17:52    Recent Results (from the past 2160 hours)  Specimen status report     Status: None   Collection Time: 09/12/23 12:00 AM  Result Value Ref Range   specimen status report Comment     Comment: NTI Urine Bottle NTI Urine Bottle Comment: A urine was received with no test indicated.  If testing is required on this specimen, please contact the [facility] Client Inquiry/Technical Services Department to obtain a Request for Written Authorization Form.   Urine cytology ancillary only     Status: None   Collection Time: 09/12/23  4:10 PM  Result Value Ref Range   Neisseria Gonorrhea Negative     Chlamydia Negative    Trichomonas Negative    Comment Normal Reference Ranger Chlamydia - Negative    Comment      Normal Reference Range Neisseria Gonorrhea - Negative   Comment Normal Reference Range Trichomonas - Negative   HIV Antibody (routine testing w rflx)     Status: None   Collection Time: 09/12/23  4:20 PM  Result Value Ref Range   HIV 1&2 Ab, 4th Generation NON-REACTIVE NON-REACTIVE    Comment: HIV-1 antigen and HIV-1/HIV-2 antibodies were not detected. There is no laboratory evidence of HIV infection. Geoff Dacanay Oliver PLEASE NOTE: This information has been disclosed to you from records whose confidentiality may be protected by state law.  If your state requires such protection, then the state law prohibits you from making any further disclosure of the information without the specific written consent of the person to whom it pertains, or as otherwise permitted by law. A general authorization for the release of medical or other information is NOT sufficient for this purpose. . For additional information please refer to http://education.[facility].com/faq/FAQ106 (This link is being provided for informational/ educational purposes only.) . Kimisha Eunice Oliver The performance of this assay has not been clinically validated in patients less than 55 years old. .   RPR     Status: None   Collection Time: 09/12/23  4:20 PM  Result Value Ref Range   RPR Ser Ql NON-REACTIVE NON-REACTIVE    Comment: . No laboratory evidence of syphilis. If recent exposure is suspected, submit a new sample in 2-4 weeks. .   Urine Culture     Status: None   Collection Time: 09/16/23  3:50 PM   Specimen: Urine  Result Value Ref Range   MICRO NUMBER: [id number]  SPECIMEN QUALITY: Adequate    Sample Source URINE    STATUS: FINAL    Result: No Growth   Urinalysis, Routine w reflex microscopic     Status: None   Collection Time: 09/16/23  3:50 PM  Result Value Ref Range   Color, Urine YELLOW Yellow;Lt.  Yellow;Straw;Dark Yellow;Amber;Green;Red;Brown   APPearance CLEAR Clear;Turbid;Slightly Cloudy;Cloudy   Specific Gravity, Urine 1.020 1.000 - 1.030   pH 7.0 5.0 - 8.0   Total Protein, Urine NEGATIVE Negative   Urine Glucose NEGATIVE Negative   Ketones, ur NEGATIVE Negative   Bilirubin Urine NEGATIVE Negative   Hgb urine dipstick NEGATIVE Negative   Urobilinogen, UA 0.2 0.0 - 1.0   Leukocytes,Ua NEGATIVE Negative   Nitrite NEGATIVE Negative   WBC, UA none seen 0-2/hpf   RBC / HPF none seen 0-2/hpf  Urine cytology ancillary only     Status: None   Collection Time: 09/16/23  3:50 PM  Result Value Ref Range   Neisseria Gonorrhea Negative    Chlamydia Negative    Comment Normal Reference Ranger Chlamydia - Negative    Comment      Normal Reference Range Neisseria Gonorrhea - Negative  Urine cytology ancillary only     Status: None   Collection Time: 10/02/23  4:28 PM  Result Value Ref Range   Neisseria Gonorrhea Negative    Chlamydia Negative    Trichomonas Negative    Comment Normal Reference Range Trichomonas - Negative    Comment Normal Reference Ranger Chlamydia - Negative    Comment      Normal Reference Range Neisseria Gonorrhea - Negative  POCT Urinalysis Dipstick (Automated)     Status: Normal   Collection Time: 10/02/23  4:37 PM  Result Value Ref Range   Color, UA yellow    Clarity, UA clear    Glucose, UA Negative Negative   Bilirubin, UA neg    Ketones, UA neg    Spec Grav, UA 1.020 1.010 - 1.025   Blood, UA neg    pH, UA 6.5 5.0 - 8.0   Protein, UA Negative Negative   Urobilinogen, UA 0.2 0.2 or 1.0 E.U./dL   Nitrite, UA neg    Leukocytes, UA Negative Negative  Hepatitis B surface antigen     Status: None   Collection Time: 11/01/23  3:55 PM  Result Value Ref Range   Hepatitis B Surface Ag NON-REACTIVE NON-REACTIVE    Comment: . For additional information, please refer to  http://education.[facility].com/faq/FAQ202  (This link is being provided for  informational/ educational purposes only.) .   Hepatitis B surface antibody,qualitative     Status: Abnormal   Collection Time: 11/01/23  3:55 PM  Result Value Ref Range   Hep B S Ab REACTIVE (A) NON-REACTIVE  Hepatitis B core antibody, total     Status: None   Collection Time: 11/01/23  3:55 PM  Result Value Ref Range   Hep B Core Total Ab NON-REACTIVE NON-REACTIVE    Comment: . For additional information, please refer to  http://education.[facility].com/faq/FAQ202  (This link is being provided for informational/ educational purposes only.) .   HCV Ab w Reflex to Quant PCR     Status: None   Collection Time: 11/01/23  3:55 PM  Result Value Ref Range   HCV Ab Non Reactive Non Reactive  QuantiFERON-TB Gold Plus     Status: None   Collection Time: 11/01/23  3:55 PM  Result Value Ref Range   QuantiFERON-TB Gold Plus NEGATIVE NEGATIVE  Comment: Negative test result. M. tuberculosis complex  infection unlikely.    NIL 0.01 IU/mL   Mitogen-NIL 9.05 IU/mL   TB1-NIL 0.09 IU/mL   TB2-NIL 0.12 IU/mL    Comment: . The Nil tube value reflects the background interferon gamma immune response of the patient's blood sample. This value has been subtracted from the patient's displayed TB and Mitogen results. . Lower than expected results with the Mitogen tube prevent false-negative Quantiferon readings by detecting a patient with a potential immune suppressive condition and/or suboptimal pre-analytical specimen handling. . The TB1 Antigen tube is coated with the M. tuberculosis-specific antigens designed to elicit responses from TB antigen primed CD4+ helper T-lymphocytes. . The TB2 Antigen tube is coated with the M. tuberculosis-specific antigens designed to elicit responses from TB antigen primed CD4+ helper and CD8+ cytotoxic T-lymphocytes. . For additional information, please refer to https://education.[facility].com/faq/FAQ204 (This link is being provided  for informational/ educational purposes only.) .   Cyclic citrul peptide antibody, IgG     Status: None   Collection Time: 11/01/23  3:55 PM  Result Value Ref Range   Cyclic Citrullin Peptide Ab <16 UNITS    Comment: Reference Range Negative:            <20 Weak Positive:       20-39 Moderate Positive:   40-59 Strong Positive:     >59 .   Rheumatoid Factor     Status: None   Collection Time: 11/01/23  3:55 PM  Result Value Ref Range   Rheumatoid fact SerPl-aCnc <10 <14 IU/mL  HLA-B27 antigen     Status: None   Collection Time: 11/01/23  3:55 PM  Result Value Ref Range   HLA-B27 Antigen NEGATIVE NEGATIVE  Lyme Disease Serology w/Reflex     Status: None   Collection Time: 11/01/23  3:55 PM  Result Value Ref Range   Lyme Total Antibody EIA Negative Negative    Comment: Lyme antibodies not detected. Reflex testing is not indicated. No laboratory evidence of infection with B. burgdorferi (Lyme disease). Negative results may occur in patients recently infected (less than or equal to 14 days) with B. burgdorferi.  If recent infection is suspected, repeat testing on a new sample collected in 7 to 14 days is recommended.   Blood culture (routine single)     Status: None   Collection Time: 11/01/23  3:55 PM   Specimen: Blood  Result Value Ref Range   MICRO NUMBER: CANCELED     Comment: Result canceled by the ancillary.   SPECIMEN QUALITY: CANCELED     Comment: Result canceled by the ancillary.   Source CANCELED     Comment: Result canceled by the ancillary.   STATUS: CANCELED     Comment: Result canceled by the ancillary.   Result: CANCELED     Comment: Result canceled by the ancillary.  ANA w/Reflex     Status: None   Collection Time: 11/01/23  3:55 PM  Result Value Ref Range   Anti Nuclear Antibody (ANA) Negative Negative  Interpretation:     Status: None   Collection Time: 11/01/23  3:55 PM  Result Value Ref Range   HCV Interp 1: Comment     Comment: Not infected with  HCV unless early or acute infection is suspected (which may be delayed in an immunocompromised individual), or other evidence exists to indicate HCV infection.   C-reactive protein     Status: None   Collection Time: 11/01/23  4:35 PM  Result  Value Ref Range   CRP <3.0 <8.0 mg/L  CBC w/Diff     Status: None   Collection Time: 11/01/23  4:35 PM  Result Value Ref Range   WBC 5.0 3.8 - 10.8 Thousand/uL   RBC 4.45 4.20 - 5.80 Million/uL   Hemoglobin 14.4 13.2 - 17.1 g/dL   HCT 29.5 28.4 - 13.2 %   MCV 94.2 80.0 - 100.0 fL   MCH 32.4 27.0 - 33.0 pg   MCHC 34.4 32.0 - 36.0 g/dL    Comment: For adults, a slight decrease in the calculated MCHC value (in the range of 30 to 32 g/dL) is most likely not clinically significant; however, it should be interpreted with caution in correlation with other red cell parameters and the patient's clinical condition.    RDW 12.2 11.0 - 15.0 %   Platelets 257 140 - 400 Thousand/uL   MPV 9.7 7.5 - 12.5 fL   Neutro Abs 2,500 1,500 - 7,800 cells/uL   Absolute Lymphocytes 2,025 850 - 3,900 cells/uL   Absolute Monocytes 325 200 - 950 cells/uL   Eosinophils Absolute 110 15 - 500 cells/uL   Basophils Absolute 40 0 - 200 cells/uL   Neutrophils Relative % 50 %   Total Lymphocyte 40.5 %   Monocytes Relative 6.5 %   Eosinophils Relative 2.2 %   Basophils Relative 0.8 %  Comp Met (CMET)     Status: None   Collection Time: 11/01/23  4:35 PM  Result Value Ref Range   Glucose, Bld 84 65 - 99 mg/dL    Comment: .            Fasting reference interval .    BUN 12 7 - 25 mg/dL   Creat 4.40 1.02 - 7.25 mg/dL   eGFR 366 > OR = 60 YQ/IHK/7.42V9   BUN/Creatinine Ratio SEE NOTE: 6 - 22 (calc)    Comment:    Not Reported: BUN and Creatinine are within    reference range. .    Sodium 139 135 - 146 mmol/L   Potassium 4.0 3.5 - 5.3 mmol/L   Chloride 102 98 - 110 mmol/L   CO2 29 20 - 32 mmol/L   Calcium 9.3 8.6 - 10.3 mg/dL   Total Protein 7.0 6.1 - 8.1 g/dL    Albumin 4.6 3.6 - 5.1 g/dL   Globulin 2.4 1.9 - 3.7 g/dL (calc)   AG Ratio 1.9 1.0 - 2.5 (calc)   Total Bilirubin 0.7 0.2 - 1.2 mg/dL   Alkaline phosphatase (APISO) 64 36 - 130 U/L   AST 23 10 - 40 U/L   ALT 27 9 - 46 U/L  Sedimentation rate     Status: None   Collection Time: 11/01/23  4:35 PM  Result Value Ref Range   Sed Rate 2 0 - 15 mm/h        Carnell Christian, MD, MS

## 2023-11-20 ENCOUNTER — Emergency Department (HOSPITAL_BASED_OUTPATIENT_CLINIC_OR_DEPARTMENT_OTHER)

## 2023-11-20 ENCOUNTER — Emergency Department (HOSPITAL_BASED_OUTPATIENT_CLINIC_OR_DEPARTMENT_OTHER)
Admission: EM | Admit: 2023-11-20 | Discharge: 2023-11-20 | Disposition: A | Discharge status: Home / Self Care | Attending: Emergency Medicine | Admitting: Emergency Medicine

## 2023-11-20 ENCOUNTER — Other Ambulatory Visit: Payer: Self-pay

## 2023-11-20 ENCOUNTER — Encounter: Payer: Self-pay | Admitting: Family Medicine

## 2023-11-20 ENCOUNTER — Encounter (HOSPITAL_BASED_OUTPATIENT_CLINIC_OR_DEPARTMENT_OTHER): Payer: Self-pay | Admitting: Emergency Medicine

## 2023-11-20 ENCOUNTER — Ambulatory Visit: Admitting: Family Medicine

## 2023-11-20 ENCOUNTER — Ambulatory Visit: Payer: Self-pay

## 2023-11-20 DIAGNOSIS — R509 Fever, unspecified: Secondary | ICD-10-CM | POA: Insufficient documentation

## 2023-11-20 DIAGNOSIS — M7918 Myalgia, other site: Secondary | ICD-10-CM | POA: Insufficient documentation

## 2023-11-20 LAB — CBC WITH DIFFERENTIAL/PLATELET
Abs Immature Granulocytes: 0.03 10*3/uL (ref 0.00–0.07)
Basophils Absolute: 0 10*3/uL (ref 0.0–0.1)
Basophils Relative: 0 %
Eosinophils Absolute: 0 10*3/uL (ref 0.0–0.5)
Eosinophils Relative: 0 %
HCT: 41.1 % (ref 39.0–52.0)
Hemoglobin: 14.9 g/dL (ref 13.0–17.0)
Immature Granulocytes: 0 %
Lymphocytes Relative: 11 %
Lymphs Abs: 1.4 10*3/uL (ref 0.7–4.0)
MCH: 32.3 pg (ref 26.0–34.0)
MCHC: 36.3 g/dL — ABNORMAL HIGH (ref 30.0–36.0)
MCV: 89.2 fL (ref 80.0–100.0)
Monocytes Absolute: 0.9 10*3/uL (ref 0.1–1.0)
Monocytes Relative: 7 %
Neutro Abs: 10.1 10*3/uL — ABNORMAL HIGH (ref 1.7–7.7)
Neutrophils Relative %: 82 %
Platelets: 252 10*3/uL (ref 150–400)
RBC: 4.61 MIL/uL (ref 4.22–5.81)
RDW: 11.9 % (ref 11.5–15.5)
WBC: 12.4 10*3/uL — ABNORMAL HIGH (ref 4.0–10.5)
nRBC: 0 % (ref 0.0–0.2)

## 2023-11-20 LAB — URIC ACID: Uric Acid, Serum: 4.5 mg/dL (ref 4.0–7.8)

## 2023-11-20 LAB — URINALYSIS, ROUTINE W REFLEX MICROSCOPIC
Bilirubin Urine: NEGATIVE
Glucose, UA: NEGATIVE mg/dL
Hgb urine dipstick: NEGATIVE
Ketones, ur: NEGATIVE mg/dL
Leukocytes,Ua: NEGATIVE
Nitrite: NEGATIVE
Protein, ur: NEGATIVE mg/dL
Specific Gravity, Urine: 1.01 (ref 1.005–1.030)
pH: 6.5 (ref 5.0–8.0)

## 2023-11-20 LAB — TROPONIN T, HIGH SENSITIVITY
Troponin T High Sensitivity: <15 ng/L (ref <19)
Troponin T High Sensitivity: <15 ng/L (ref <19)

## 2023-11-20 LAB — COMPREHENSIVE METABOLIC PANEL WITH GFR
ALT: 39 U/L (ref 0–44)
AST: 32 U/L (ref 15–41)
Albumin: 5 g/dL (ref 3.5–5.0)
Alkaline Phosphatase: 83 U/L (ref 38–126)
Anion gap: 13 (ref 5–15)
BUN: 15 mg/dL (ref 6–20)
CO2: 25 mmol/L (ref 22–32)
Calcium: 10.1 mg/dL (ref 8.9–10.3)
Chloride: 100 mmol/L (ref 98–111)
Creatinine, Ser: 0.92 mg/dL (ref 0.61–1.24)
GFR, Estimated: >60 mL/min (ref >60)
Glucose, Bld: 84 mg/dL (ref 70–99)
Potassium: 4.1 mmol/L (ref 3.5–5.1)
Sodium: 138 mmol/L (ref 135–145)
Total Bilirubin: 0.7 mg/dL (ref 0.0–1.2)
Total Protein: 7.5 g/dL (ref 6.5–8.1)

## 2023-11-20 LAB — LACTIC ACID, PLASMA: Lactic Acid, Venous: 1.5 mmol/L (ref 0.5–1.9)

## 2023-11-20 LAB — MONONUCLEOSIS SCREEN: Mono Screen: NEGATIVE

## 2023-11-20 LAB — RESP PANEL BY RT-PCR (RSV, FLU A&B, COVID)  RVPGX2
Influenza A by PCR: NEGATIVE
Influenza B by PCR: NEGATIVE
Resp Syncytial Virus by PCR: NEGATIVE
SARS Coronavirus 2 by RT PCR: NEGATIVE

## 2023-11-20 MED ORDER — IOHEXOL 300 MG/ML  SOLN
100.0000 mL | Freq: Once | INTRAMUSCULAR | Status: AC | PRN
  Administered 2023-11-20: 100 mL via INTRAVENOUS

## 2023-11-20 MED ORDER — SODIUM CHLORIDE 0.9 % IV BOLUS
1000.0000 mL | Freq: Once | INTRAVENOUS | Status: AC
  Administered 2023-11-20: 1000 mL via INTRAVENOUS

## 2023-11-20 NOTE — ED Triage Notes (Signed)
 Tx yesterday for same, PT c/o generalized body aches , for a year, consistently worsening worsening when cold intermittently. Referred to rheumatologist

## 2023-11-20 NOTE — ED Notes (Signed)
 X2 sets of blood cultures obtained before any abx started

## 2023-11-20 NOTE — Telephone Encounter (Signed)
  Chief Complaint: aches, chills,  Symptoms: aches, chills,  Frequency: ongoing for about a year, intermittent, worse recently Pertinent Negatives: Patient denies CP Disposition: [] ED /[] Urgent Care (no appt availability in office) / [x] Appointment(In office/virtual)/ []  [facility]/ [] Home Care/ [] Refused Recommended Disposition /[] [facility]/ []  Follow-up with PCP Additional Notes: Pt states that he has been having "spells" for awhile. Pt states that last night he woke at 0300 with aches, cold, pain over entire body. Pt states that he is having an episode at this time as well. 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 Pt states urine is clear, yellow.  Copied from CRM [id number]. Topic: Clinical - Red Word Triage >> Nov 20, 2023  8:46 AM William Oliver wrote: Reason for CRM: patient would like to be called regarding his blood work and patient stated last night he had a spell where he was having chills, achy, shaking and pain in his body Reason for Disposition  [1] SEVERE pain (e.g., excruciating, unable to do any normal activities) AND [2] not improved 2 hours after pain medicine  Answer Assessment - Initial Assessment Questions 1. ONSET: "When did the muscle aches or body pains start?"      Ongoing for about a year, worsining 2. LOCATION: "What part of your body is hurting?" (e.g., entire body, arms, legs)      Entire body 3. SEVERITY: "How bad is the pain?" (Scale 1-10; or mild, moderate, severe)   - MILD (1-3): doesn't interfere with normal activities    - MODERATE (4-7): interferes with normal activities or awakens from sleep    - SEVERE (8-10):  excruciating pain, unable to do any normal activities      8 4. CAUSE: "What do you think is causing the pains?"     unsure 5. FEVER: "Have you been having fever?"     Unsure if fever, but has chills at this time 6. OTHER SYMPTOMS: "Do you have any other  symptoms?" (e.g., chest pain, weakness, rash, cold or flu symptoms, weight loss)     Denies CP, feels like sometimes hard time breathing 8. TRAVEL: "Have you traveled out of the country in the last month?" (e.g., travel history, exposures)     denies  Protocols used: Muscle Aches and Body Pain-A-AH

## 2023-11-20 NOTE — ED Notes (Signed)
 Spoke with lab about urine specimen

## 2023-11-20 NOTE — ED Provider Notes (Signed)
 [place] [facility]ER[facility] DEPARTMENT AT [facility] Provider Note   CSN: [id number] Arrival date & time: 11/20/23  1034     History  Chief Complaint  Patient presents with   Generalized Body Aches    William Oliver is a 43 y.o. male.  Pt is a 43 yo male with pmhx significant for chronic pain (on lortab and mobic ).  He has been having body aches, chills for about a year.  He said the cold makes it worse.  Sx have been getting more frequent lately.  He said he gets a fever and hr goes up when he gets a spell.  He has seen his pcp and pcp has checked all the usual things.  Inflammatory and autoimmune markers are normal.  He's been referred to rheumatology, but appt is not until October.  He saw pcp yesterday and was given an IM injection of solumedrol.  Pt does not feel like that did anything.  Today, at work, he had a spell and went to the nurse.  He said temp was 100 and HR was 137.  He feels better now, but feels really frustrated.  He said he can't live like this as it's affecting the quality of his life.       Home Medications Prior to Admission medications   Medication Sig Start Date End Date Taking? Authorizing Provider  albuterol  (VENTOLIN  HFA) 108 (90 Base) MCG/ACT inhaler Inhale 2 puffs into the lungs every 6 (six) hours as needed for wheezing or shortness of breath. 04/29/23   Catheryn Cluck, MD  cyclobenzaprine  (FLEXERIL ) 10 MG tablet Take 1/2-1 tablet (5-10 mg total) by mouth 3 (three) times daily as needed for muscle spasms. avoid opoids & by 1-2 hours to avoid oversedation 03/24/23     cyclobenzaprine  (FLEXERIL ) 10 MG tablet Take 1/2-1 tablet (5-10 mg total) by mouth 3 (three) times daily as needed for muscle spasms. Space opioids and muscle relaxer 1-2 hours apart to avoid oversedation. 08/13/23     cyclobenzaprine  (FLEXERIL ) 10 MG tablet Take 0.5-1 tablets (5-10 mg total) by mouth 3 (three) times daily as needed for muscle spasms. Space opioids and muscle relaxer 1-2 hours  apart to avoid oversedation. 10/09/23     HYDROcodone -acetaminophen  (NORCO) 10-325 MG tablet Take 1 tablet by mouth 5 (five) times daily as needed for pain 07/26/22     HYDROcodone -acetaminophen  (NORCO) 10-325 MG tablet Take 1 tablet by mouth 5 (five) times daily as needed for pain 03/28/23     HYDROcodone -acetaminophen  (NORCO) 10-325 MG tablet Take 1 tablet by mouth 5 (five) times daily as needed for pain. 04/22/23     HYDROcodone -acetaminophen  (NORCO) 10-325 MG tablet Take 1 tablet by mouth 5 (five) times daily as needed for pain. 05/20/23     HYDROcodone -acetaminophen  (NORCO) 10-325 MG tablet Take 1 tablet by mouth 5 (five) times daily as needed for pain. 08/13/23     HYDROcodone -acetaminophen  (NORCO) 10-325 MG tablet Take 1 tablet by mouth 5 (five) times daily as needed for pain. 10/09/23     HYDROcodone -acetaminophen  (NORCO) 10-325 MG tablet Take 1 tablet by mouth 5 (five) times daily as needed for pain. 11/11/23     meloxicam  (MOBIC ) 15 MG tablet Take 1 tablet (15 mg total) by mouth daily. 06/25/22     meloxicam  (MOBIC ) 15 MG tablet 1 tab by mouth daily 11/11/23     naloxone  (NARCAN ) nasal spray 4 mg/0.1 mL Use 1 spray as needed, IF FOUND UNRESPONSIVE THEN SPRAY THIS INTO NOSE AND CALL  911 IMMEDIATELY 10/24/22     naloxone  (NARCAN ) nasal spray 4 mg/0.1 mL 1 (one) Spray as needed, IF FOUND UNRESPONSIVE THEN SPRAY THIS INTO NOSE AND CALL 911 IMMEDIATELY 11/11/23     pregabalin  (LYRICA ) 75 MG capsule Take 1 capsule (75 mg total) by mouth 3 (three) times daily for neuropathy 11/11/23         Allergies    Penicillins    Review of Systems   Review of Systems  Constitutional:  Positive for fever.  Musculoskeletal:  Positive for arthralgias and myalgias.  All other systems reviewed and are negative.   Physical Exam Updated Vital Signs BP (!) 135/95   Pulse 81   Temp 99.3 F (37.4 C)   Resp 13   Ht 5' 11.5" (1.816 m)   Wt 78 kg   SpO2 100%   BMI 23.65 kg/m  Physical Exam Vitals and nursing note  reviewed.  Constitutional:      Appearance: Normal appearance.  HENT:     Head: Normocephalic and atraumatic.     Right Ear: External ear normal.     Left Ear: External ear normal.     Nose: Nose normal.     Mouth/Throat:     Mouth: Mucous membranes are moist.     Pharynx: Oropharynx is clear.  Eyes:     Extraocular Movements: Extraocular movements intact.     Conjunctiva/sclera: Conjunctivae normal.     Pupils: Pupils are equal, round, and reactive to light.  Cardiovascular:     Rate and Rhythm: Normal rate and regular rhythm.     Pulses: Normal pulses.     Heart sounds: Normal heart sounds.  Pulmonary:     Effort: Pulmonary effort is normal.     Breath sounds: Normal breath sounds.  Abdominal:     General: Abdomen is flat. Bowel sounds are normal.     Palpations: Abdomen is soft.  Musculoskeletal:        General: Normal range of motion.     Cervical back: Normal range of motion and neck supple.  Skin:    General: Skin is warm.     Capillary Refill: Capillary refill takes less than 2 seconds.  Neurological:     General: No focal deficit present.     Mental Status: He is alert and oriented to person, place, and time.  Psychiatric:        Mood and Affect: Mood normal.        Behavior: Behavior normal.     ED Results / Procedures / Treatments   Labs (all labs ordered are listed, but only abnormal results are displayed) Labs Reviewed  CBC WITH DIFFERENTIAL/PLATELET - Abnormal; Notable for the following components:      Result Value   WBC 12.4 (*)    MCHC 36.3 (*)    Neutro Abs 10.1 (*)    All other components within normal limits  URINALYSIS, ROUTINE W REFLEX MICROSCOPIC - Abnormal; Notable for the following components:   Color, Urine COLORLESS (*)    All other components within normal limits  RESP PANEL BY RT-PCR (RSV, FLU A&B, COVID)  RVPGX2  CULTURE, BLOOD (ROUTINE X 2)  CULTURE, BLOOD (ROUTINE X 2)  COMPREHENSIVE METABOLIC PANEL WITH GFR  LACTIC ACID, PLASMA   MONONUCLEOSIS SCREEN  TROPONIN T, HIGH SENSITIVITY  TROPONIN T, HIGH SENSITIVITY    EKG EKG Interpretation Date/Time:  Wednesday November 20 2023 11:46:37 EDT Ventricular Rate:  78 PR Interval:  185 QRS Duration:  93 QT  Interval:  379 QTC Calculation: 432 R Axis:   60  Text Interpretation: Sinus rhythm ST elev, probable normal early repol pattern No significant change since last tracing Confirmed by Sueellen Emery [id number]) on 11/20/2023 11:51:57 AM  Radiology CT CHEST ABDOMEN PELVIS W CONTRAST Result Date: 11/20/2023 CLINICAL DATA:  Sepsis. EXAM: CT CHEST, ABDOMEN, AND PELVIS WITH CONTRAST TECHNIQUE: Multidetector CT imaging of the chest, abdomen and pelvis was performed following the standard protocol during bolus administration of intravenous contrast. RADIATION DOSE REDUCTION: This exam was performed according to the departmental dose-optimization program which includes automated exposure control, adjustment of the mA and/or kV according to patient size and/or use of iterative reconstruction technique. CONTRAST:  100mL OMNIPAQUE IOHEXOL 300 MG/ML  SOLN COMPARISON:  Chest CT dated 04/22/2023. FINDINGS: CT CHEST FINDINGS Cardiovascular: There is no cardiomegaly or pericardial effusion. The thoracic aorta is unremarkable. The origins of the great vessels of the aortic arch and the central pulmonary arteries appear patent. Mediastinum/Nodes: No hilar or mediastinal adenopathy. The esophagus and the thyroid gland are grossly unremarkable. No mediastinal fluid collection. Lungs/Pleura: The lungs are clear. There is no pleural effusion or pneumothorax. The central airways are patent. Musculoskeletal: No chest wall mass or suspicious bone lesions identified. CT ABDOMEN PELVIS FINDINGS No intra-abdominal free air or free fluid. Hepatobiliary: No focal liver abnormality is seen. No gallstones, gallbladder wall thickening, or biliary dilatation. Pancreas: Unremarkable. No pancreatic ductal dilatation or  surrounding inflammatory changes. Spleen: Normal in size without focal abnormality. Adrenals/Urinary Tract: The adrenal glands are unremarkable. There is no hydronephrosis on either side the visualized ureters and urinary bladder appear unremarkable. Stomach/Bowel: There is no bowel obstruction or active inflammation. The appendix is not visualized with certainty. No inflammatory changes identified in the right lower quadrant. Vascular/Lymphatic: The abdominal aorta and IVC unremarkable. Left-sided infrarenal IVC anatomy. No portal gas. There is no adenopathy. Reproductive: The prostate and seminal vesicles are grossly unremarkable. No pelvic mass. Other: None Musculoskeletal: No acute or significant osseous findings. IMPRESSION: No acute intrathoracic, abdominal, or pelvic pathology. Electronically Signed   By: Angus Bark M.D.   On: 11/20/2023 13:22    Procedures Procedures    Medications Ordered in ED Medications  sodium chloride 0.9 % bolus 1,000 mL (1,000 mLs Intravenous New Bag/Given 11/20/23 1155)  iohexol (OMNIPAQUE) 300 MG/ML solution 100 mL (100 mLs Intravenous Contrast Given 11/20/23 1303)    ED Course/ Medical Decision Making/ A&P                                 Medical Decision Making Amount and/or Complexity of Data Reviewed Labs: ordered. Radiology: ordered.  Risk Prescription drug management.   This patient presents to the ED for concern of myalgias/fever, this involves an extensive number of treatment options, and is a complaint that carries with it a high risk of complications and morbidity.  The differential diagnosis includes autoimmune d/o, cancer, electrolyte abn, infection   Co morbidities that complicate the patient evaluation  chronic pain (on lortab and mobic )   Additional history obtained:  Additional history obtained from epic chart review External records from outside source obtained and reviewed including sig other   Lab Tests:  I Ordered,  and personally interpreted labs.  The pertinent results include:  cbc with wbc sl elevated at 12.4, cmp nl, mono neg, lactic nl, trop nl   Imaging Studies ordered:  I ordered imaging studies including ct chest/abd/pelvis  I  independently visualized and interpreted imaging which showed No acute intrathoracic, abdominal, or pelvic pathology.  I agree with the radiologist interpretation   Cardiac Monitoring:  The patient was maintained on a cardiac monitor.  I personally viewed and interpreted the cardiac monitored which showed an underlying rhythm of: nsr   Medicines ordered and prescription drug management:  I ordered medication including ivfs  for sx  Reevaluation of the patient after these medicines showed that the patient improved I have reviewed the patients home medicines and have made adjustments as needed   Test Considered:  ct   Critical Interventions:  ivfs   Problem List / ED Course:  Myalgias:  unclear etiology.  Pt is feeling well now.  Work up neg.  He is stable for d/c.  Return if worse.  F/u with pcp.   Reevaluation:  After the interventions noted above, I reevaluated the patient and found that they have :improved   Social Determinants of Health:  Lives at home   Dispostion:  After consideration of the diagnostic results and the patients response to treatment, I feel that the patent would benefit from discharge with outpatient f/u         Final Clinical Impression(s) / ED Diagnoses Final diagnoses:  Myalgia, multiple sites    Rx / DC Orders ED Discharge Orders     None         Sueellen Emery, MD 11/20/23 1444

## 2023-11-21 ENCOUNTER — Telehealth: Payer: Self-pay

## 2023-11-21 NOTE — Telephone Encounter (Signed)
 Copied from CRM [id number]. Topic: Clinical - Lab/Test Results >> Nov 21, 2023 12:46 PM William Oliver wrote: Reason for CRM: Patient was admitted to the hospital and did lab work. He was wondering if Dr. Hildy Lowers could go over his results and go over it with him. I did let him know that the hospital will most likely follow up once provider has reviewed all labs completed.

## 2023-11-25 LAB — CULTURE, BLOOD (ROUTINE X 2)
Culture: NO GROWTH
Culture: NO GROWTH
Special Requests: ADEQUATE
Special Requests: ADEQUATE

## 2023-11-25 LAB — CULTURE, BLOOD (SINGLE)

## 2023-11-28 ENCOUNTER — Telehealth: Payer: Self-pay | Admitting: Family Medicine

## 2023-11-28 NOTE — Telephone Encounter (Signed)
 Copied from CRM [id number]. Topic: Referral - Question >> Nov 28, 2023 12:28 PM [name] wrote: Reason for CRM: PT STATED HE HAS ANOTHER LOCATION THAT HE WOULD LIKE TO GO FOR HIS RHEUMATOLOGY APPT. [facility] STATED THEY CAN SEE HIM IN [place]. HE WOULD LIKE THIS INFORMATION SENT TO THEM.

## 2023-12-01 ENCOUNTER — Ambulatory Visit
Admission: EM | Admit: 2023-12-01 | Discharge: 2023-12-01 | Disposition: A | Discharge status: Home / Self Care | Attending: Physician Assistant | Admitting: Physician Assistant

## 2023-12-01 DIAGNOSIS — J45901 Unspecified asthma with (acute) exacerbation: Secondary | ICD-10-CM

## 2023-12-01 MED ORDER — AZITHROMYCIN 250 MG PO TABS: 250.0000 mg | ORAL_TABLET | Freq: Every day | ORAL | 0 refills | Status: DC

## 2023-12-01 MED ORDER — PREDNISONE 20 MG PO TABS: 40.0000 mg | ORAL_TABLET | Freq: Every day | ORAL | 0 refills | Status: AC

## 2023-12-01 NOTE — ED Triage Notes (Signed)
"  This started with possible allergy flare-up with cough now hoarse voice, I feel like I am having some sob/wheezing now". I was seen in the ED on 11-20-2023 but had very mild symptoms so not addressed for this. No fever. "I do have PCP but not addressed this".

## 2023-12-04 ENCOUNTER — Ambulatory Visit (HOSPITAL_BASED_OUTPATIENT_CLINIC_OR_DEPARTMENT_OTHER)

## 2023-12-04 ENCOUNTER — Ambulatory Visit (HOSPITAL_BASED_OUTPATIENT_CLINIC_OR_DEPARTMENT_OTHER): Admitting: Orthopaedic Surgery

## 2023-12-04 ENCOUNTER — Other Ambulatory Visit (HOSPITAL_BASED_OUTPATIENT_CLINIC_OR_DEPARTMENT_OTHER): Payer: Self-pay

## 2023-12-04 DIAGNOSIS — G8929 Other chronic pain: Secondary | ICD-10-CM

## 2023-12-04 DIAGNOSIS — M25562 Pain in left knee: Secondary | ICD-10-CM

## 2023-12-04 MED ORDER — LIDOCAINE HCL 1 % IJ SOLN
4.0000 mL | INTRAMUSCULAR | Status: AC | PRN
  Administered 2023-12-04: 4 mL

## 2023-12-04 MED ORDER — TRIAMCINOLONE ACETONIDE 40 MG/ML IJ SUSP
80.0000 mg | INTRAMUSCULAR | Status: AC | PRN
  Administered 2023-12-04: 80 mg via INTRA_ARTICULAR

## 2023-12-04 NOTE — Progress Notes (Signed)
 Chief Complaint: Left knee pain     History of Present Illness:    KIETH Oliver is a 43 y.o. male presents today with ongoing left knee pain for the last several years.  He states that he has had a previous injection in this knee but no previous MRIs.  He does have somewhat mechanical symptoms with isolated medial joint line pain.    PMH/PSH/Family History/Social History/Meds/Allergies:    Past Medical History:  Diagnosis Date  . Allergy 1993   Penicillin  . 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 Laceration    left middle finger  . MRSA (methicillin resistant Staphylococcus aureus)    Past Surgical History:  Procedure Laterality Date  . HAND SURGERY    . TENDON REPAIR Left 01/04/2014   Procedure: TENDON REPAIR;  Surgeon: Hedy Living, MD;  Location: [facility];  Service: Orthopedics;  Laterality: Left;  . WISDOM TOOTH EXTRACTION     Social History   Socioeconomic History  . Marital status: Single    Spouse name: Not on file  . Number of children: 0  . Years of education: Not on file  . Highest education level: 12th grade  Occupational History  . Occupation: Biomedical engineer: [employer]  Tobacco Use  . Smoking status: Never    Passive exposure: Never  . Smokeless tobacco: Never  Vaping Use  . Vaping status: Never Used  Substance and Sexual Activity  . Alcohol use: No  . Drug use: Not Currently  . Sexual activity: Yes    Birth control/protection: Surgical  Other Topics Concern  . Not on file  Social History Narrative  . Not on file   Social Drivers of Health   Financial Resource Strain: Low Risk  (09/12/2023)   Overall Financial Resource Strain (CARDIA)   . Difficulty of Paying Living Expenses: Not hard at all  Food Insecurity: No Food Insecurity (09/12/2023)   Hunger Vital Sign   . Worried About Programme researcher, broadcasting/film/video in the Last Year: Never true   . Ran Out of Food in the Last Year: Never true  Transportation  Needs: No Transportation Needs (09/12/2023)   PRAPARE - Transportation   . Lack of Transportation (Medical): No   . Lack of Transportation (Non-Medical): No  Physical Activity: Sufficiently Active (09/12/2023)   Exercise Vital Sign   . Days of Exercise per Week: 5 days   . Minutes of Exercise per Session: 30 min  Stress: No Stress Concern Present (09/12/2023)   [employer] of Occupational Health - Occupational Stress Questionnaire   . Feeling of Stress : Not at all  Social Connections: Moderately Isolated (09/12/2023)   Social Connection and Isolation Panel [NHANES]   . Frequency of Communication with Friends and Family: More than three times a week   . Frequency of Social Gatherings with Friends and Family: Three times a week   . Attends Religious Services: More than 4 times per year   . Active Member of Clubs or Organizations: No   . Attends Banker Meetings: Not on file   . Marital Status: Never married   Family History  Problem Relation Age of Onset  . Lupus Mother   . Arthritis Mother   . Cancer Mother   . COPD Father   . Throat cancer Father        never smoker  . Cancer Father   . Vision loss Father   . Emphysema Maternal Grandmother   .  Clotting disorder Maternal Grandmother   . COPD Maternal Grandmother   . Heart disease Maternal Grandmother   . Kidney disease Maternal Grandfather   . Cancer Paternal Grandmother   . COPD Paternal Grandmother   . Lung cancer Paternal William Oliver        was a smoker  . Emphysema Paternal Grandfather   . Cancer Paternal Grandfather   . Heart disease Maternal Uncle    Allergies  Allergen Reactions  . Penicillins Hives and Swelling   Current Outpatient Medications  Medication Sig Dispense Refill  . albuterol  (VENTOLIN  HFA) 108 (90 Base) MCG/ACT inhaler Inhale 2 puffs into the lungs every 6 (six) hours as needed for wheezing or shortness of breath. 6.7 g 0  . azithromycin  (ZITHROMAX ) 250 MG tablet Take 1 tablet (250  mg total) by mouth daily. Take first 2 tablets together, then 1 every day until finished. 6 tablet 0  . cyclobenzaprine  (FLEXERIL ) 10 MG tablet Take 1/2-1 tablet (5-10 mg total) by mouth 3 (three) times daily as needed for muscle spasms. Space opioids and muscle relaxer 1-2 hours apart to avoid oversedation. 90 tablet 0  . HYDROcodone -acetaminophen  (NORCO) 10-325 MG tablet Take 1 tablet by mouth 5 (five) times daily as needed for pain 150 tablet 0  . meloxicam  (MOBIC ) 15 MG tablet 1 tab by mouth daily 90 tablet 0  . naloxone  (NARCAN ) nasal spray 4 mg/0.1 mL 1 (one) Spray as needed, IF FOUND UNRESPONSIVE THEN SPRAY THIS INTO NOSE AND CALL 911 IMMEDIATELY 1 each 3  . predniSONE (DELTASONE) 20 MG tablet Take 2 tablets (40 mg total) by mouth daily with breakfast for 5 days. 10 tablet 0  . pregabalin  (LYRICA ) 75 MG capsule Take 1 capsule (75 mg total) by mouth 3 (three) times daily for neuropathy 90 capsule 0   No current facility-administered medications for this visit.   No results found.  Review of Systems:   A ROS was performed including pertinent positives and negatives as documented in the HPI.  Physical Exam :   Constitutional: NAD and appears stated age Neurological: Alert and oriented Psych: Appropriate affect and cooperative There were no vitals taken for this visit.   Comprehensive Musculoskeletal Exam:    Left knee with positive McMurray medially and joint line tenderness, negative Lachman, negative posterior drawer range of motion is from 3 to 130 degrees his neurosensory exam is intact   Imaging:   Xray (4 views left knee): Normal    I personally reviewed and interpreted the radiographs.   Assessment and Plan:   43 y.o. male with evidence of a left knee medial meniscal tear.  At today's visit he is requesting a left knee ultrasound-guided injection which I have provided after verbal consent was obtained.  He will call me back should this wear off or not provide complete  effectiveness  -Left knee ultrasound-guided injection provided after verbal consent obtained    Procedure Note  Patient: William Oliver             Date of Birth: [birth year]           MRN: [id number]             Visit Date: 12/04/2023  Procedures: Visit Diagnoses:  1. Chronic pain of left knee     Large Joint Inj: L knee on 12/04/2023 5:43 PM Indications: pain Details: 22 G 1.5 in needle, ultrasound-guided anterior approach  Arthrogram: No  Medications: 4 mL lidocaine  1 %; 80 mg triamcinolone acetonide 40 MG/ML  Outcome: tolerated well, no immediate complications Procedure, treatment alternatives, risks and benefits explained, specific risks discussed. Consent was given by the patient. Immediately prior to procedure a time out was called to verify the correct patient, procedure, equipment, support staff and site/side marked as required. Patient was prepped and draped in the usual sterile fashion.        I personally saw and evaluated the patient, and participated in the management and treatment plan.  Wilhelmenia Harada, MD Attending Physician, Orthopedic Surgery  This document was dictated using Dragon voice recognition software. A reasonable attempt at proof reading has been made to minimize errors.

## 2023-12-09 ENCOUNTER — Other Ambulatory Visit (HOSPITAL_COMMUNITY): Payer: Self-pay

## 2023-12-09 MED ORDER — MELOXICAM 15 MG PO TABS
15.0000 mg | ORAL_TABLET | Freq: Every day | ORAL | 0 refills | Status: DC
  Filled 2023-12-09: qty 90, 90d supply, fill #0

## 2023-12-09 MED ORDER — PREGABALIN 75 MG PO CAPS
75.0000 mg | ORAL_CAPSULE | Freq: Three times a day (TID) | ORAL | 0 refills | Status: DC
  Filled 2023-12-09: qty 90, 30d supply, fill #0

## 2023-12-09 MED ORDER — CYCLOBENZAPRINE HCL 10 MG PO TABS
5.0000 mg | ORAL_TABLET | Freq: Three times a day (TID) | ORAL | 0 refills | Status: DC | PRN
  Filled 2023-12-09: qty 90, 30d supply, fill #0

## 2023-12-09 MED ORDER — HYDROCODONE-ACETAMINOPHEN 10-325 MG PO TABS
1.0000 | ORAL_TABLET | Freq: Every day | ORAL | 0 refills | Status: DC
  Filled 2023-12-09 – 2023-12-17 (×2): qty 150, 30d supply, fill #0

## 2023-12-09 NOTE — ED Provider Notes (Signed)
 [facility]    CSN: [id number] Arrival date & time: 12/01/23  1103      History   Chief Complaint Chief Complaint  Patient presents with   Asthma Problem    HPI William Oliver is a 43 y.o. male.   Patient here today for evaluation of cough and hoarse voice that started after allergy flare almost 2 weeks ago.  He reports that he was seen in the emergency room but had very mild symptoms at that time he did not have fever.  He reports he has continued to have more shortness of breath and wheezing.  He does have known asthma.  The history is provided by the patient.    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 Authorizing Provider  albuterol  (VENTOLIN  HFA) 108 (90 Base) MCG/ACT inhaler Inhale 2 puffs into the lungs every 6 (six) hours as needed for wheezing or shortness of breath. 04/29/23  Yes  Catheryn Cluck, MD  azithromycin  (ZITHROMAX ) 250 MG tablet Take 1 tablet (250 mg total) by mouth daily. Take first 2 tablets together, then 1 every day until finished. 12/01/23  Yes Vernestine Gondola, PA-C  cyclobenzaprine  (FLEXERIL ) 10 MG tablet Take 1/2-1 tablet (5-10 mg total) by mouth 3 (three) times daily as needed for muscle spasms. Space opioids and muscle relaxer 1-2 hours apart to avoid oversedation. 08/13/23  Yes   HYDROcodone -acetaminophen  (NORCO) 10-325 MG tablet Take 1 tablet by mouth 5 (five) times daily as needed for pain 07/26/22  Yes   meloxicam  (MOBIC ) 15 MG tablet 1 tab by mouth daily 11/11/23  Yes   cyclobenzaprine  (FLEXERIL ) 10 MG tablet Take 0.5-1 tablets (5-10 mg total) by mouth 3 (three) times daily as needed for muscle spasms. Space from opioids 1-2 hours per safe medication instructions. 12/09/23     HYDROcodone -acetaminophen  (NORCO) 10-325 MG tablet Take 1 tablet by mouth 5 (five) times daily as needed for pain. 12/09/23     meloxicam  (MOBIC ) 15 MG tablet Take 1 tablet (15 mg total) by mouth daily. 12/09/23     naloxone  (NARCAN ) nasal spray 4 mg/0.1 mL 1 (one) Spray as needed, IF FOUND UNRESPONSIVE THEN SPRAY THIS INTO NOSE AND CALL 911 IMMEDIATELY 11/11/23     pregabalin  (LYRICA ) 75 MG capsule Take 1 capsule (75 mg total) by mouth 3 (three) times daily for neuropathy. 12/09/23  Family History Family History  Problem Relation Age of Onset   Lupus Mother    Arthritis Mother    Cancer Mother    COPD Father    Throat cancer Father        never smoker   Cancer Father    Vision loss Father    Emphysema Maternal Grandmother    Clotting disorder Maternal Grandmother    COPD Maternal Grandmother    Heart disease Maternal Grandmother    Kidney disease Maternal Grandfather    Cancer Paternal Grandmother    COPD Paternal Grandmother    Lung cancer Paternal Grandfather        was a smoker   Emphysema Paternal Grandfather    Cancer Paternal Grandfather    Heart disease  Maternal Uncle     Social History Social History   Tobacco Use   Smoking status: Never    Passive exposure: Never   Smokeless tobacco: Never  Vaping Use   Vaping status: Never Used  Substance Use Topics   Alcohol use: No   Drug use: Not Currently     Allergies   Penicillins   Review of Systems Review of Systems  Constitutional:  Negative for chills and fever.  HENT:  Positive for congestion and voice change. Negative for ear pain.   Eyes:  Negative for discharge and redness.  Respiratory:  Positive for cough, shortness of breath and wheezing.   Gastrointestinal:  Negative for abdominal pain, nausea and vomiting.     Physical Exam Triage Vital Signs ED Triage Vitals  Encounter Vitals Group     BP 12/01/23 1130 123/79     Systolic BP Percentile --      Diastolic BP Percentile --      Pulse Rate 12/01/23 1130 72     Resp 12/01/23 1130 20     Temp 12/01/23 1130 98.4 F (36.9 C)     Temp Source 12/01/23 1130 Oral     SpO2 12/01/23 1130 98 %     Weight 12/01/23 1126 174 lb (78.9 kg)     Height 12/01/23 1126 5' 11.5" (1.816 m)     Head Circumference --      Peak Flow --      Pain Score 12/01/23 1123 0     Pain Loc --      Pain Education --      Exclude from Growth Chart --    No data found.  Updated Vital Signs BP 123/79 (BP Location: Left Arm)   Pulse 72   Temp 98.4 F (36.9 C) (Oral)   Resp 20   Ht 5' 11.5" (1.816 m)   Wt 174 lb (78.9 kg)   SpO2 98%   BMI 23.93 kg/m   Visual Acuity Right Eye Distance:   Left Eye Distance:   Bilateral Distance:    Right Eye Near:   Left Eye Near:    Bilateral Near:     Physical Exam Vitals and nursing note reviewed.  Constitutional:      General: He is not in acute distress.    Appearance: Normal appearance. He is not ill-appearing.  HENT:     Head: Normocephalic and atraumatic.     Nose: Congestion present.     Mouth/Throat:     Mouth: Mucous membranes are moist.     Pharynx: Oropharynx is clear. No  oropharyngeal exudate or posterior oropharyngeal erythema.  Eyes:     Conjunctiva/sclera: Conjunctivae normal.  Cardiovascular:     Rate  and Rhythm: Normal rate and regular rhythm.     Heart sounds: Normal heart sounds. No murmur heard. Pulmonary:     Effort: Pulmonary effort is normal. No respiratory distress.     Breath sounds: No wheezing, rhonchi or rales.  Skin:    General: Skin is warm and dry.  Neurological:     Mental Status: He is alert.  Psychiatric:        Mood and Affect: Mood normal.        Thought Content: Thought content normal.      UC Treatments / Results  Labs (all labs ordered are listed, but only abnormal results are displayed) Labs Reviewed - No data to display  EKG   Radiology No results found.  Procedures Procedures (including critical care time)  Medications Ordered in UC Medications - No data to display  Initial Impression / Assessment and Plan / UC Course  I have reviewed the triage vital signs and the nursing notes.  Pertinent labs & imaging results that were available during my care of the patient were reviewed by me and considered in my medical decision making (see chart for details).    Will treat to cover asthma exacerbation with steroid burst and Z-Pak.  Recommended he continue albuterol  as needed.  Encouraged follow-up if no gradual improvement or with any worsening symptoms.  Patient expressed understanding.  Final Clinical Impressions(s) / UC Diagnoses   Final diagnoses:  Asthma with acute exacerbation, unspecified asthma severity, unspecified whether persistent   Discharge Instructions   None    ED Prescriptions     Medication Sig Dispense Auth. Provider   predniSONE  (DELTASONE ) 20 MG tablet Take 2 tablets (40 mg total) by mouth daily with breakfast for 5 days. 10 tablet Vernestine Gondola, PA-C   azithromycin  (ZITHROMAX ) 250 MG tablet Take 1 tablet (250 mg total) by mouth daily. Take first 2 tablets together, then 1 every  day until finished. 6 tablet Vernestine Gondola, PA-C      PDMP not reviewed this encounter.   Vernestine Gondola, PA-C 12/09/23 1746

## 2023-12-10 ENCOUNTER — Other Ambulatory Visit: Payer: Self-pay

## 2023-12-17 ENCOUNTER — Other Ambulatory Visit: Payer: Self-pay

## 2023-12-17 ENCOUNTER — Other Ambulatory Visit (HOSPITAL_COMMUNITY): Payer: Self-pay

## 2023-12-20 ENCOUNTER — Encounter: Payer: Self-pay | Admitting: Family Medicine

## 2024-01-01 NOTE — Telephone Encounter (Deleted)
 Copied from CRM [id number]. Topic: Medical Record Request - Records Request >> Dec 26, 2023  8:27 AM Dimple Francis wrote: Reason for CRM: Patient requesting his information on lab records sent to [facility] fax # [phone]

## 2024-01-01 NOTE — Telephone Encounter (Signed)
 Error

## 2024-01-01 NOTE — Telephone Encounter (Signed)
 Copied from CRM [id number]. Topic: Medical Record Request - Records Request >> Dec 26, 2023  8:27 AM Dimple Francis wrote: Reason for CRM: Patient requesting his information on lab records sent to [facility] fax # [phone]

## 2024-01-01 NOTE — Telephone Encounter (Signed)
 LVM for New Patient coordinator at [facility] to inquire what is needed from our office for this pt. CAL telephone number provided.

## 2024-01-02 NOTE — Progress Notes (Signed)
 [facility] Initial Consultation   Referring Provider Catheryn Cluck, MD [address] [place],  [place] [address]  Primary Care Provider Catheryn Cluck, MD  Patient Profile: William Oliver is a 43 y.o. male who is seen in consultation in the [facility] at the request of Dr. Hildy Lowers for evaluation and management of the problem(s) noted below.  Problem List: Hematochezia Nausea Weight loss   History of Present Illness   William Oliver is a 43 y.o. male with a history of allergic rhinitis, asthma, plantar fascitiis, pleurisy, sciatica who presents to the office for evaluation and management of hematochezia, nausea and weight loss  -- Reports the presence of hematochezia x 5 years which has become progressive in nature -- Having 1-2 bowel movements a day -- Stools fluctuate from being formed to diarrhea in nature -- Sees dark blood with more than 50% of bowel movements -- Has had rare anorectal discomfort but not on a regular basis -- No abdominal pain or cramping -- No other history of bleeding diathesis-no gum bleeds or nosebleeds  -- Endorses symptoms of nausea but no vomiting -- States that he feels that it is harder to eat -- No symptoms of GERD -- Denies dysphagia or odynophagia  -- Describes his healthy weight as being 173-174 pounds -- Current weight is 165 pounds and states that weight loss has been recent  -- GI symptoms have been occurring in the context of spells consisting of chills, body aches and pains, elevated blood pressure and fever -- He has been referred to [facility] rheumatology and will be seen on 01/13/2024 -- States that his mother had a form of lupus and he is concerned regarding the possibility of an autoimmune condition  -- No family history of IBD or colorectal cancer  -- When he was seen by his PCP stool studies were requested for FOBT, calprotectin lactoferrin test but he has not submitted these  Last  colonoscopy:  None Last endoscopy:  None  Last Abd CT/CTE/MRE:  None  GI Review of Symptoms Significant for hematochezia, diarrhea. Otherwise negative.  General Review of Systems  Review of systems is significant for the pertinent positives and negatives as listed per the HPI.  Full ROS is otherwise negative.  Past Medical History   Past Medical History:  Diagnosis Date   Allergy 1993   Penicillin   Degenerative disorder of bone 08/08/2023   Laceration    left middle finger   MRSA (methicillin resistant Staphylococcus aureus)      Past Surgical History   Past Surgical History:  Procedure Laterality Date   HAND SURGERY     TENDON REPAIR Left 01/04/2014   Procedure: TENDON REPAIR;  Surgeon: Hedy Living, MD;  Location: [facility];  Service: Orthopedics;  Laterality: Left;   WISDOM TOOTH EXTRACTION       Allergies and Medications   Allergies  Allergen Reactions   Penicillins Hives and Swelling    Current Meds  Medication Sig   cyclobenzaprine  (FLEXERIL ) 10 MG tablet Take 1/2-1 tablet (5-10 mg total) by mouth 3 (three) times daily as needed for muscle spasms. Space opioids and muscle relaxer 1-2 hours apart to avoid oversedation.   cyclobenzaprine  (FLEXERIL ) 10 MG tablet Take 0.5-1 tablets (5-10 mg total) by mouth 3 (three) times daily as needed for muscle spasms. Space from opioids 1-2 hours per safe medication instructions.   HYDROcodone -acetaminophen  (NORCO) 10-325 MG tablet Take 1 tablet by mouth 5 (five) times daily as needed  for pain   HYDROcodone -acetaminophen  (NORCO) 10-325 MG tablet Take 1 tablet by mouth 5 (five) times daily as needed for pain.   meloxicam  (MOBIC ) 15 MG tablet 1 tab by mouth daily   meloxicam  (MOBIC ) 15 MG tablet Take 1 tablet (15 mg total) by mouth daily.   [EXPIRED] Na Sulfate-K Sulfate-Mg Sulfate concentrate (SUPREP) 17.5-3.13-1.6 GM/177ML SOLN Take 1 kit (354 mLs total) by mouth once for 1 dose.   naloxone  (NARCAN ) nasal  spray 4 mg/0.1 mL 1 (one) Spray as needed, IF FOUND UNRESPONSIVE THEN SPRAY THIS INTO NOSE AND CALL 911 IMMEDIATELY   ondansetron  (ZOFRAN -ODT) 8 MG disintegrating tablet Take 1 tablet (8 mg total) by mouth every 8 (eight) hours as needed for nausea or vomiting.   pregabalin  (LYRICA ) 75 MG capsule Take 1 capsule (75 mg total) by mouth 3 (three) times daily for neuropathy.     Family History   Family History  Problem Relation Age of Onset   Lupus Mother    Arthritis Mother    Cancer Mother    COPD Father    Throat cancer Father        never smoker   Cancer Father    Vision loss Father    Emphysema Maternal Grandmother    Clotting disorder Maternal Grandmother    COPD Maternal Grandmother    Heart disease Maternal Grandmother    Kidney disease Maternal Grandfather    Cancer Paternal Grandmother    COPD Paternal Grandmother    Lung cancer Paternal Grandfather        was a smoker   Emphysema Paternal Grandfather    Cancer Paternal Grandfather    Heart disease Maternal Uncle      Social History   Social History   Tobacco Use   Smoking status: Never    Passive exposure: Never   Smokeless tobacco: Never  Vaping Use   Vaping status: Never Used  Substance Use Topics   Alcohol use: No   Drug use: Not Currently   William Oliver reports that he has never smoked. He has never been exposed to tobacco smoke. He has never used smokeless tobacco. He reports that he does not currently use drugs. He reports that he does not drink alcohol.  Vital Signs and Physical Examination   Vitals:   01/03/24 0948  BP: 120/80  Pulse: 80   Body mass index is 23.01 kg/m. Weight: 165 lb (74.8 kg)  General: Well developed, well nourished, no acute distress Head: Normocephalic and atraumatic Eyes: Sclerae anicteric, EOMI Lungs: Clear throughout to auscultation Heart: Regular rate and rhythm; No murmurs, rubs or bruits Abdomen: Soft, non tender and non distended. No masses, hepatosplenomegaly or  hernias noted. Normal Bowel sounds Rectal: Deferred Musculoskeletal: Symmetrical with no gross deformities   Review of Data  The following data was reviewed at the time of this encounter:  Laboratory Studies      Latest Ref Rng & Units 01/03/2024   11:13 AM 11/20/2023   11:50 AM 11/01/2023    4:35 PM  CBC  WBC 4.0 - 10.5 K/uL 6.1  12.4  5.0   Hemoglobin 13.0 - 17.0 g/dL 82.9  56.2  13.0   Hematocrit 39.0 - 52.0 % 46.9  41.1  41.9   Platelets 150.0 - 400.0 K/uL 251.0  252  257     No results found for: LIPASE    Latest Ref Rng & Units 01/03/2024   11:13 AM 11/20/2023   11:50 AM 11/01/2023    4:35 PM  CMP  Glucose 70 - 99 mg/dL 90  84  84   BUN 6 - 23 mg/dL 12  15  12    Creatinine 0.40 - 1.50 mg/dL 8.29  5.62  1.30   Sodium 135 - 145 mEq/L 138  138  139   Potassium 3.5 - 5.1 mEq/L 4.4  4.1  4.0   Chloride 96 - 112 mEq/L 100  100  102   CO2 19 - 32 mEq/L 32  25  29   Calcium 8.4 - 10.5 mg/dL 86.5  78.4  9.3   Total Protein 6.0 - 8.3 g/dL 8.0  7.5  7.0   Total Bilirubin 0.2 - 1.2 mg/dL 1.3  0.7  0.7   Alkaline Phos 39 - 117 U/L 56  83    AST 0 - 37 U/L 21  32  23   ALT 0 - 53 U/L 21  39  27    Lab Results  Component Value Date   ESRSEDRATE 1 01/03/2024   Lab Results  Component Value Date   CRP <1.0 01/03/2024    Lab Results  Component Value Date   INR 1.0 01/03/2024    Lab Results  Component Value Date   IRON 154 01/03/2024   TIBC 373.8 01/03/2024   FERRITIN 42.3 01/03/2024    Vitamin B12 397 Folate 9.9  Celiac panel normal  Imaging Studies  None  GI Procedures and Studies  None   Clinical Impression  It is my clinical impression that William Oliver is a 43 y.o. male with;  Hematochezia Nausea Weight loss  William Oliver presents to the office today for evaluation of hematochezia, nausea and weight loss.  He reports a 5-year history of hematochezia which has progressively worsened over the recent past in conjunction with nausea and unintentional weight loss.   His GI symptoms are occurring in the context of episodic spells that consist of chills, body aches and pains, elevated blood pressure and fever.  He reports a family history of lupus in his mother and is concerned regarding the possibility of an autoimmune condition.  We discussed that it would certainly be appropriate to evaluate for inflammatory bowel disease as a consideration given his symptoms and age.  Other diagnostic considerations that would account for his lower GI bleeding could include polyps, malignancy, AVMs.  His upper GI symptoms and nausea could be part and parcel of a global process or related to other etiologies such as GERD, gastritis, H. pylori infection, celiac disease or nonulcer dyspepsia. He will be seen by rheumatology in the near future given his other systemic symptoms.  A form of familial Mediterranean fever could be another consideration albeit rare and less likely.  For further investigation of his symptoms I have recommended laboratory and stool testing as well as upper endoscopy and colonoscopy.  I reviewed potential risks of endoscopic procedures including anesthesia, aspiration, perforation and bleeding and William Oliver was amenable to proceeding with these tests in the future.  Plan  Labs today: CBC, CMP, ESR, CRP, INR, nutritional parameters Stool studies: Fecal calprotectin, C. difficile, GI stool pathogen panel Schedule EGD and colonoscopy at [facility] Follow-up with [facility] rheumatology 01/13/2024 Monitor weight and anthropometrics Prescription provided today for ondansetron   8 mg ODT tablet p.o. every 8 hours as needed for nausea and vomiting   Planned Follow Up 2 months  The patient or caregiver verbalized understanding of the material covered, with no barriers to understanding. All questions were answered. Patient or caregiver is agreeable with the plan  outlined above.    It was a pleasure to see William Oliver.  If you have any questions or concerns regarding this evaluation,  do not hesitate to contact me.  Eugenia Hess, MD [facility]

## 2024-01-03 ENCOUNTER — Encounter: Payer: Self-pay | Admitting: Pediatrics

## 2024-01-03 ENCOUNTER — Ambulatory Visit: Admitting: Pediatrics

## 2024-01-03 ENCOUNTER — Other Ambulatory Visit (INDEPENDENT_AMBULATORY_CARE_PROVIDER_SITE_OTHER)

## 2024-01-03 VITALS — BP 120/80 | HR 80 | Ht 71.0 in | Wt 165.0 lb

## 2024-01-03 DIAGNOSIS — K921 Melena: Secondary | ICD-10-CM | POA: Diagnosis not present

## 2024-01-03 DIAGNOSIS — R634 Abnormal weight loss: Secondary | ICD-10-CM | POA: Diagnosis not present

## 2024-01-03 DIAGNOSIS — R110 Nausea: Secondary | ICD-10-CM | POA: Diagnosis not present

## 2024-01-03 LAB — COMPREHENSIVE METABOLIC PANEL WITH GFR
ALT: 21 U/L (ref 0–53)
AST: 21 U/L (ref 0–37)
Albumin: 5.3 g/dL — ABNORMAL HIGH (ref 3.5–5.2)
Alkaline Phosphatase: 56 U/L (ref 39–117)
BUN: 12 mg/dL (ref 6–23)
CO2: 32 meq/L (ref 19–32)
Calcium: 10 mg/dL (ref 8.4–10.5)
Chloride: 100 meq/L (ref 96–112)
Creatinine, Ser: 0.91 mg/dL (ref 0.40–1.50)
GFR: 104.77 mL/min (ref >60.00)
Glucose, Bld: 90 mg/dL (ref 70–99)
Potassium: 4.4 meq/L (ref 3.5–5.1)
Sodium: 138 meq/L (ref 135–145)
Total Bilirubin: 1.3 mg/dL — ABNORMAL HIGH (ref 0.2–1.2)
Total Protein: 8 g/dL (ref 6.0–8.3)

## 2024-01-03 LAB — CBC WITH DIFFERENTIAL/PLATELET
Basophils Absolute: 0 10*3/uL (ref 0.0–0.1)
Basophils Relative: 0.8 % (ref 0.0–3.0)
Eosinophils Absolute: 0.1 10*3/uL (ref 0.0–0.7)
Eosinophils Relative: 0.8 % (ref 0.0–5.0)
HCT: 46.9 % (ref 39.0–52.0)
Hemoglobin: 16.5 g/dL (ref 13.0–17.0)
Lymphocytes Relative: 29.9 % (ref 12.0–46.0)
Lymphs Abs: 1.8 10*3/uL (ref 0.7–4.0)
MCHC: 35.2 g/dL (ref 30.0–36.0)
MCV: 91.3 fl (ref 78.0–100.0)
Monocytes Absolute: 0.4 10*3/uL (ref 0.1–1.0)
Monocytes Relative: 7.2 % (ref 3.0–12.0)
Neutro Abs: 3.7 10*3/uL (ref 1.4–7.7)
Neutrophils Relative %: 61.3 % (ref 43.0–77.0)
Platelets: 251 10*3/uL (ref 150.0–400.0)
RBC: 5.14 Mil/uL (ref 4.22–5.81)
RDW: 13 % (ref 11.5–15.5)
WBC: 6.1 10*3/uL (ref 4.0–10.5)

## 2024-01-03 LAB — IBC + FERRITIN
Ferritin: 42.3 ng/mL (ref 22.0–322.0)
Iron: 154 ug/dL (ref 42–165)
Saturation Ratios: 41.2 % (ref 20.0–50.0)
TIBC: 373.8 ug/dL (ref 250.0–450.0)
Transferrin: 267 mg/dL (ref 212.0–360.0)

## 2024-01-03 LAB — PROTIME-INR
INR: 1 ratio (ref 0.8–1.0)
Prothrombin Time: 11.1 s (ref 9.6–13.1)

## 2024-01-03 LAB — B12 AND FOLATE PANEL
Folate: 9.9 ng/mL (ref >5.9)
Vitamin B-12: 397 pg/mL (ref 211–911)

## 2024-01-03 LAB — SEDIMENTATION RATE: Sed Rate: 1 mm/h (ref 0–15)

## 2024-01-03 LAB — C-REACTIVE PROTEIN: CRP: <1 mg/dL (ref 0.5–20.0)

## 2024-01-03 LAB — VITAMIN D 25 HYDROXY (VIT D DEFICIENCY, FRACTURES): VITD: 35.14 ng/mL (ref 30.00–100.00)

## 2024-01-03 MED ORDER — ONDANSETRON 8 MG PO TBDP: 8.0000 mg | ORAL_TABLET | Freq: Three times a day (TID) | ORAL | 0 refills | Status: DC | PRN

## 2024-01-03 MED ORDER — NA SULFATE-K SULFATE-MG SULF 17.5-3.13-1.6 GM/177ML PO SOLN
1.0000 | Freq: Once | ORAL | 0 refills | Status: AC
Start: 2024-01-03 — End: 2024-01-03

## 2024-01-03 NOTE — Patient Instructions (Addendum)
 Your provider has requested that you go to the basement level for lab work before leaving today. Press B on the elevator. The lab is located at the first door on the left as you exit the elevator.  Due to recent changes in healthcare laws, you may see the results of your imaging and laboratory studies on MyChart before your provider has had a chance to review them.  We understand that in some cases there may be results that are confusing or concerning to you. Not all laboratory results come back in the same time frame and the provider may be waiting for multiple results in order to interpret others.  Please give us  48 hours in order for your provider to thoroughly review all the results before contacting the office for clarification of your results.    Your provider has ordered [employer] stool testing for you. You have received a kit from our office today containing all necessary supplies to complete this test. Please carefully read the stool collection instructions provided in the kit before opening the accompanying materials. In addition, be sure there is a label providing your full name and date of birth on the puritan opti-swab tube that is supplied in the kit (if you do not see a label with this information on your test tube, please make us  aware before test collection!). After completing the test, you should secure the purtian tube into the specimen biohazard bag. [facility] E-Req sheet (including date and time of specimen collection) should be placed into the outside pocket of the specimen biohazard bag and returned to the [facility] lab (basement floor of [facility]) within 3 days of collection. Please make sure to give the specimen to a staff member at the lab. DO NOT leave the specimen on the counter.   If the specimen date and time (can be found in the upper right boxed portion of the sheet) are not filled out on the E-Req sheet, the test will NOT be  performed.    We have sent the following medications to your pharmacy for you to pick up at your convenience: Zofran  8 mg ODT, take 1 tablet every 8 hours as needed for nausea and/ or vomiting.  Follow up in 2 months.  You have been scheduled for an endoscopy and colonoscopy. Please follow the written instructions given to you at your visit today.  If you use inhalers (even only as needed), please bring them with you on the day of your procedure.  DO NOT TAKE 7 DAYS PRIOR TO TEST- Trulicity (dulaglutide) Ozempic, Wegovy (semaglutide) Mounjaro (tirzepatide) Bydureon Bcise (exanatide extended release)  DO NOT TAKE 1 DAY PRIOR TO YOUR TEST Rybelsus (semaglutide) Adlyxin (lixisenatide) Victoza (liraglutide) Byetta (exanatide) ___________________________________________________________________________   Thank you for entrusting me with your care and for choosing [facility], Dr. Eugenia Hess  _______________________________________________________  If your blood pressure at your visit was 140/90 or greater, please contact your primary care physician to follow up on this.  _______________________________________________________  If you are age 41 or older, your body mass index should be between 23-30. Your Body mass index is 23.01 kg/m. If this is out of the aforementioned range listed, please consider follow up with your Primary Care Provider.  If you are age 44 or younger, your body mass index should be between 19-25. Your Body mass index is 23.01 kg/m. If this is out of the aformentioned range listed, please consider follow up with your Primary Care Provider.  ________________________________________________________  The [facility] providers would like to encourage you to use MYCHART to communicate with providers for non-urgent requests or questions.  Due to long hold times on the telephone, sending your provider a message by [facility] may be a faster and more efficient  way to get a response.  Please allow 48 business hours for a response.  Please remember that this is for non-urgent requests.  _______________________________________________________

## 2024-01-04 ENCOUNTER — Ambulatory Visit: Payer: Self-pay | Admitting: Pediatrics

## 2024-01-04 LAB — TISSUE TRANSGLUTAMINASE ABS,IGG,IGA
(tTG) Ab, IgA: <1 U/mL
(tTG) Ab, IgG: <1 U/mL

## 2024-01-04 LAB — IGA: Immunoglobulin A: 179 mg/dL (ref 47–310)

## 2024-01-06 ENCOUNTER — Other Ambulatory Visit (HOSPITAL_COMMUNITY): Payer: Self-pay

## 2024-01-06 MED ORDER — TIZANIDINE HCL 4 MG PO TABS
4.0000 mg | ORAL_TABLET | Freq: Two times a day (BID) | ORAL | 0 refills | Status: DC | PRN
  Filled 2024-01-06 – 2024-01-16 (×2): qty 60, 30d supply, fill #0

## 2024-01-06 MED ORDER — HYDROCODONE-ACETAMINOPHEN 10-325 MG PO TABS
1.0000 | ORAL_TABLET | Freq: Every day | ORAL | 0 refills | Status: DC
  Filled 2024-01-15: qty 150, 30d supply, fill #0

## 2024-01-06 MED ORDER — PREGABALIN 75 MG PO CAPS
75.0000 mg | ORAL_CAPSULE | Freq: Three times a day (TID) | ORAL | 0 refills | Status: DC
  Filled 2024-01-06 – 2024-01-16 (×2): qty 90, 30d supply, fill #0

## 2024-01-07 ENCOUNTER — Telehealth: Payer: Self-pay | Admitting: Family Medicine

## 2024-01-07 NOTE — Telephone Encounter (Signed)
 Patient dropped off document FMLA, to be filled out by provider. Patient requested to send it back via Call Patient to pick up within 7-days. Document is located in providers tray at front office.Please advise at Mobile [phone] (mobile)  pt dropped off fmla paperwork and I put in the dr box

## 2024-01-14 ENCOUNTER — Other Ambulatory Visit (HOSPITAL_COMMUNITY): Payer: Self-pay

## 2024-01-15 ENCOUNTER — Other Ambulatory Visit (HOSPITAL_COMMUNITY): Payer: Self-pay

## 2024-01-16 ENCOUNTER — Other Ambulatory Visit (HOSPITAL_COMMUNITY): Payer: Self-pay

## 2024-01-17 NOTE — Telephone Encounter (Signed)
 Patient schedule on 01/21/24 to discuss FMLA

## 2024-01-21 ENCOUNTER — Ambulatory Visit (INDEPENDENT_AMBULATORY_CARE_PROVIDER_SITE_OTHER): Admitting: Family Medicine

## 2024-01-21 ENCOUNTER — Encounter: Payer: Self-pay | Admitting: Family Medicine

## 2024-01-21 VITALS — BP 120/78 | HR 64 | Temp 97.5°F | Ht 71.0 in | Wt 174.6 lb

## 2024-01-21 DIAGNOSIS — Z758 Other problems related to medical facilities and other health care: Secondary | ICD-10-CM | POA: Diagnosis not present

## 2024-01-21 DIAGNOSIS — K921 Melena: Secondary | ICD-10-CM | POA: Diagnosis not present

## 2024-01-21 DIAGNOSIS — R1013 Epigastric pain: Secondary | ICD-10-CM | POA: Diagnosis not present

## 2024-01-21 DIAGNOSIS — M064 Inflammatory polyarthropathy: Secondary | ICD-10-CM | POA: Insufficient documentation

## 2024-01-21 MED ORDER — ONDANSETRON 8 MG PO TBDP
8.0000 mg | ORAL_TABLET | Freq: Three times a day (TID) | ORAL | 0 refills | Status: DC | PRN
Start: 2024-01-21 — End: 2024-04-09

## 2024-01-21 NOTE — Progress Notes (Signed)
 Assessment & Plan   Assessment/Plan:   Assessment & Plan Hematochezia Ongoing hematochezia under evaluation by gastroenterology. Colonoscopy scheduled due to persistent bleeding. Etiology under investigation. - Proceed with scheduled colonoscopy.  Nausea Persistent nausea with decreased appetite. Ondansetron  prescribed by gastroenterology for symptom management. - Refill ondansetron  8 mg as needed for nausea.  Joint Pain Ongoing joint pain evaluated by rheumatology. 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 Reports headaches, fatigue, and difficulty regulating body temperature, possibly related to the underlying condition. - Continue follow-up with rheumatology for further evaluation and management of joint pain and associated symptoms.  Employment Concerns Health-related challenges impacting ability to work, including fatigue and inability to perform essential job functions during flares. Concerned about job security and seeking FMLA documentation to protect employment. - Complete FMLA paperwork to support need for potential time off due to health issues. - Fax FMLA documentation to employer.      Medications Discontinued During This Encounter  Medication Reason   ondansetron  (ZOFRAN -ODT) 8 MG disintegrating tablet Reorder    No follow-ups on file.        Subjective:   Encounter date: 01/21/2024  William Oliver is a 43 y.o. male who has ALLERGIC RHINITIS; ASTHMA; Chest pain; Cough; Dyspnea; Cellulitis and abscess; Plantar fasciitis, bilateral; Frequent headaches; Reactive airway disease, mild intermittent, uncomplicated; Pleurisy; Sciatica; Degenerative disorder of bone; Scoliosis deformity of spine; Degeneration of lumbar intervertebral disc; Inflammatory polyarthritis (HCC); Hematochezia; Dyspepsia; and Concerned about care plan on their problem list..   He  has a past medical history of Allergy  (1993), Degenerative disorder of bone (08/08/2023), Inflammatory polyarthritis (HCC) (01/21/2024), Laceration, and MRSA (methicillin resistant Staphylococcus aureus).SABRA   He presents with chief complaint of Medical Management of Chronic Issues (Here for FMLA paperwork; not getting any better; issue has been progressing having more spells, come and go, making it hard to work; seeing Rheum) .   Discussed the use of AI scribe software for clinical note transcription with the patient, who gave verbal consent to proceed.  History of Present Illness William Oliver is a 43 year old male who presents for follow-up and FMLA discussion.  He has a history of hematochezia and ongoing joint pain. Despite extensive evaluations by gastroenterology and rheumatology, no definitive cause has been identified. A colonoscopy is scheduled due to significant bleeding. His weight has fluctuated, with a recent measurement of 174 pounds, and he has experienced a decline in appetite and persistent nausea. He is currently taking ondansetron  8 mg for nausea, which he finds helpful.  He experiences headache spells that are debilitating, accompanied by mild fever and elevated blood pressure. These spells occur frequently, sometimes daily, causing him to feel extremely cold and fatigued. He describes difficulty regulating his body temperature and feeling exhausted early in the day. These symptoms have impacted his ability to work.  Recent lab work includes a borderline positive ANA, negative U-PAP, normal S-PAP, and elevated anti-PR3 antibody. Complement levels were noted to be greater than sixty.  His symptoms are unpredictable, with severe episodes rendering him unable to perform job duties, such as standing or sitting for extended periods. He is concerned about the impact of his health on his employment.     ROS  Past Surgical History:  Procedure Laterality Date   HAND SURGERY     TENDON REPAIR Left 01/04/2014   Procedure:  TENDON REPAIR;  Surgeon: Donnice DELENA Robinsons, MD;  Location: [facility];  Service: Orthopedics;  Laterality: Left;   WISDOM TOOTH EXTRACTION      Outpatient Medications Prior to Visit  Medication Sig Dispense Refill   albuterol  (VENTOLIN  HFA) 108 (90 Base) MCG/ACT inhaler Inhale 2 puffs into the lungs every 6 (six) hours as needed for wheezing or shortness of breath. 6.7 g 0   cyclobenzaprine  (FLEXERIL ) 10 MG tablet Take 1/2-1 tablet (5-10 mg total) by mouth 3 (three) times daily as needed for muscle spasms. Space opioids and muscle relaxer 1-2 hours apart to avoid oversedation. 90 tablet 0   cyclobenzaprine  (FLEXERIL ) 10 MG tablet Take 0.5-1 tablets (5-10 mg total) by mouth 3 (three) times daily as needed for muscle spasms. Space from opioids 1-2 hours per safe medication instructions. 90 tablet 0   HYDROcodone -acetaminophen  (NORCO) 10-325 MG tablet Take 1 tablet by mouth 5 (five) times daily as needed for pain 150 tablet 0   HYDROcodone -acetaminophen  (NORCO) 10-325 MG tablet Take 1 tablet by mouth 5 (five) times daily as needed for pain. 150 tablet 0   meloxicam  (MOBIC ) 15 MG tablet 1 tab by mouth daily 90 tablet 0   meloxicam  (MOBIC ) 15 MG tablet Take 1 tablet (15 mg total) by mouth daily. 90 tablet 0   naloxone  (NARCAN ) nasal spray 4 mg/0.1 mL 1 (one) Spray as needed, IF FOUND UNRESPONSIVE THEN SPRAY THIS INTO NOSE AND CALL 911 IMMEDIATELY 1 each 3   pregabalin  (LYRICA ) 75 MG capsule Take 1 capsule (75 mg total) by mouth 3 (three) times daily for neuropathy. 90 capsule 0   pregabalin  (LYRICA ) 75 MG capsule Take 1 capsule (75 mg total) by mouth 3 (three) times daily for neuropathy. 90 capsule 0   tiZANidine  (ZANAFLEX ) 4 MG tablet Take 1 tablet (4 mg total) by mouth 2 (two) times daily as needed for muscle spasms. 60 tablet 0   ondansetron  (ZOFRAN -ODT) 8 MG disintegrating tablet Take 1 tablet (8 mg total) by mouth every 8 (eight) hours as needed for nausea or vomiting. 30 tablet 0    azithromycin  (ZITHROMAX ) 250 MG tablet Take 1 tablet (250 mg total) by mouth daily. Take first 2 tablets together, then 1 every day until finished. (Patient not taking: Reported on 01/21/2024) 6 tablet 0   No facility-administered medications prior to visit.    Family History  Problem Relation Age of Onset   Lupus Mother    Arthritis Mother    Cancer Mother    COPD Father    Throat cancer Father        never smoker   Cancer Father    Vision loss Father    Emphysema Maternal Grandmother    Clotting disorder Maternal Grandmother    COPD Maternal Grandmother    Heart disease Maternal Grandmother    Kidney disease Maternal Grandfather    Cancer Paternal Grandmother    COPD Paternal Grandmother    Lung cancer Paternal Grandfather        was a smoker   Emphysema Paternal Grandfather    Cancer Paternal Grandfather    Heart disease Maternal Uncle     Social History   Socioeconomic History   Marital status: Single    Spouse name: Not on file   Number of children: 0   Years of education: Not on file   Highest education level: 12th grade  Occupational History   Occupation: Biomedical engineer: [employer]  Tobacco Use   Smoking status: Never    Passive exposure: Never   Smokeless tobacco: Never  Vaping Use  Vaping status: Never Used  Substance and Sexual Activity   Alcohol use: No   Drug use: Not Currently   Sexual activity: Yes    Birth control/protection: Surgical  Other Topics Concern   Not on file  Social History Narrative   Not on file   Social Drivers of Health   Financial Resource Strain: Low Risk  (09/12/2023)   Overall Financial Resource Strain (CARDIA)    Difficulty of Paying Living Expenses: Not hard at all  Food Insecurity: No Food Insecurity (09/12/2023)   Hunger Vital Sign    Worried About Running Out of Food in the Last Year: Never true    Ran Out of Food in the Last Year: Never true  Transportation Needs: No Transportation Needs  (09/12/2023)   PRAPARE - Administrator, Civil Service (Medical): No    Lack of Transportation (Non-Medical): No  Physical Activity: Sufficiently Active (09/12/2023)   Exercise Vital Sign    Days of Exercise per Week: 5 days    Minutes of Exercise per Session: 30 min  Stress: No Stress Concern Present (09/12/2023)   [employer] of Occupational Health - Occupational Stress Questionnaire    Feeling of Stress : Not at all  Social Connections: Moderately Isolated (09/12/2023)   Social Connection and Isolation Panel    Frequency of Communication with Friends and Family: More than three times a week    Frequency of Social Gatherings with Friends and Family: Three times a week    Attends Religious Services: More than 4 times per year    Active Member of Clubs or Organizations: No    Attends Engineer, structural: Not on file    Marital Status: Never married  Intimate Partner Violence: Not on file                                                                                                  Objective:  Physical Exam: BP 120/78   Pulse 64   Temp (!) 97.5 F (36.4 C)   Ht 5' 11 (1.803 m)   Wt 174 lb 9.6 oz (79.2 kg)   SpO2 96%   BMI 24.35 kg/m   Wt Readings from Last 3 Encounters:  01/21/24 174 lb 9.6 oz (79.2 kg)  01/03/24 165 lb (74.8 kg)  12/01/23 174 lb (78.9 kg)    Physical Exam MEASUREMENTS: Weight- 174. GENERAL: Alert, cooperative, well developed, no acute distress HEENT: Normocephalic, normal oropharynx, moist mucous membranes CHEST: Clear to auscultation bilaterally, No wheezes, rhonchi, or crackles CARDIOVASCULAR: Normal heart rate and rhythm, S1 and S2 normal without murmurs ABDOMEN: Soft, non-tender, non-distended, without organomegaly, Normal bowel sounds EXTREMITIES: No cyanosis or edema NEUROLOGICAL: Cranial nerves grossly intact, Moves all extremities without gross motor or sensory deficit   Physical Exam  DG Knee Complete 4 Views  Left Result Date: 12/05/2023 EXAM: 4 OR MORE VIEW(S) XRAY OF THE LEFT KNEE 12/04/2023 03:25:19 PM COMPARISON: None available. CLINICAL HISTORY: Pain. FINDINGS: BONES AND JOINTS: No acute fracture. No focal osseous lesion. No joint dislocation. No significant joint effusion. No significant degenerative changes. SOFT  TISSUES: The soft tissues are unremarkable. IMPRESSION: 1. No significant abnormality. Electronically signed by: Pinkie Pebbles MD 12/05/2023 03:13 AM EDT RP Workstation: [id number]   CT CHEST ABDOMEN PELVIS W CONTRAST Result Date: 11/20/2023 CLINICAL DATA:  Sepsis. EXAM: CT CHEST, ABDOMEN, AND PELVIS WITH CONTRAST TECHNIQUE: Multidetector CT imaging of the chest, abdomen and pelvis was performed following the standard protocol during bolus administration of intravenous contrast. RADIATION DOSE REDUCTION: This exam was performed according to the departmental dose-optimization program which includes automated exposure control, adjustment of the mA and/or kV according to patient size and/or use of iterative reconstruction technique. CONTRAST:  OMNIPAQUE  IOHEXOL  300 MG/ML  SOLN COMPARISON:  Chest CT dated 04/22/2023. FINDINGS: CT CHEST FINDINGS Cardiovascular: There is no cardiomegaly or pericardial effusion. The thoracic aorta is unremarkable. The origins of the great vessels of the aortic arch and the central pulmonary arteries appear patent. Mediastinum/Nodes: No hilar or mediastinal adenopathy. The esophagus and the thyroid gland are grossly unremarkable. No mediastinal fluid collection. Lungs/Pleura: The lungs are clear. There is no pleural effusion or pneumothorax. The central airways are patent. Musculoskeletal: No chest wall mass or suspicious bone lesions identified. CT ABDOMEN PELVIS FINDINGS No intra-abdominal free air or free fluid. Hepatobiliary: No focal liver abnormality is seen. No gallstones, gallbladder wall thickening, or biliary dilatation. Pancreas: Unremarkable. No  pancreatic ductal dilatation or surrounding inflammatory changes. Spleen: Normal in size without focal abnormality. Adrenals/Urinary Tract: The adrenal glands are unremarkable. There is no hydronephrosis on either side the visualized ureters and urinary bladder appear unremarkable. Stomach/Bowel: There is no bowel obstruction or active inflammation. The appendix is not visualized with certainty. No inflammatory changes identified in the right lower quadrant. Vascular/Lymphatic: The abdominal aorta and IVC unremarkable. Left-sided infrarenal IVC anatomy. No portal gas. There is no adenopathy. Reproductive: The prostate and seminal vesicles are grossly unremarkable. No pelvic mass. Other: None Musculoskeletal: No acute or significant osseous findings. IMPRESSION: No acute intrathoracic, abdominal, or pelvic pathology. Electronically Signed   By: Vanetta Chou M.D.   On: 11/20/2023 13:22    Recent Results (from the past 2160 hours)  Hepatitis B surface antigen     Status: None   Collection Time: 11/01/23  3:55 PM  Result Value Ref Range   Hepatitis B Surface Ag NON-REACTIVE NON-REACTIVE    Comment: . For additional information, please refer to  http://education.[facility].com/faq/FAQ202  (This link is being provided for informational/ educational purposes only.) .   Hepatitis B surface antibody,qualitative     Status: Abnormal   Collection Time: 11/01/23  3:55 PM  Result Value Ref Range   Hep B S Ab REACTIVE (A) NON-REACTIVE  Hepatitis B core antibody, total     Status: None   Collection Time: 11/01/23  3:55 PM  Result Value Ref Range   Hep B Core Total Ab NON-REACTIVE NON-REACTIVE    Comment: . For additional information, please refer to  http://education.[facility].com/faq/FAQ202  (This link is being provided for informational/ educational purposes only.) .   HCV Ab w Reflex to Quant PCR     Status: None   Collection Time: 11/01/23  3:55 PM  Result Value Ref Range   HCV  Ab Non Reactive Non Reactive  QuantiFERON-TB Gold Plus     Status: None   Collection Time: 11/01/23  3:55 PM  Result Value Ref Range   QuantiFERON-TB Gold Plus NEGATIVE NEGATIVE    Comment: Negative test result. M. tuberculosis complex  infection unlikely.    NIL 0.01 IU/mL   Mitogen-NIL 9.05  IU/mL   TB1-NIL 0.09 IU/mL   TB2-NIL 0.12 IU/mL    Comment: . The Nil tube value reflects the background interferon gamma immune response of the patient's blood sample. This value has been subtracted from the patient's displayed TB and Mitogen results. . Lower than expected results with the Mitogen tube prevent false-negative Quantiferon readings by detecting a patient with a potential immune suppressive condition and/or suboptimal pre-analytical specimen handling. . The TB1 Antigen tube is coated with the M. tuberculosis-specific antigens designed to elicit responses from TB antigen primed CD4+ helper T-lymphocytes. . The TB2 Antigen tube is coated with the M. tuberculosis-specific antigens designed to elicit responses from TB antigen primed CD4+ helper and CD8+ cytotoxic T-lymphocytes. . For additional information, please refer to https://education.[facility].com/faq/FAQ204 (This link is being provided for informational/ educational purposes only.) .   Cyclic citrul peptide antibody, IgG     Status: None   Collection Time: 11/01/23  3:55 PM  Result Value Ref Range   Cyclic Citrullin Peptide Ab <83 UNITS    Comment: Reference Range Negative:            <20 Weak Positive:       20-39 Moderate Positive:   40-59 Strong Positive:     >59 .   Rheumatoid Factor     Status: None   Collection Time: 11/01/23  3:55 PM  Result Value Ref Range   Rheumatoid fact SerPl-aCnc <10 <14 IU/mL  HLA-B27 antigen     Status: None   Collection Time: 11/01/23  3:55 PM  Result Value Ref Range   HLA-B27 Antigen NEGATIVE NEGATIVE  Lyme Disease Serology w/Reflex     Status: None    Collection Time: 11/01/23  3:55 PM  Result Value Ref Range   Lyme Total Antibody EIA Negative Negative    Comment: Lyme antibodies not detected. Reflex testing is not indicated. No laboratory evidence of infection with B. burgdorferi (Lyme disease). Negative results may occur in patients recently infected (less than or equal to 14 days) with B. burgdorferi.  If recent infection is suspected, repeat testing on a new sample collected in 7 to 14 days is recommended.   Blood culture (routine single)     Status: None   Collection Time: 11/01/23  3:55 PM   Specimen: Blood  Result Value Ref Range   MICRO NUMBER: CANCELED     Comment: Result canceled by the ancillary.   SPECIMEN QUALITY: CANCELED     Comment: Result canceled by the ancillary.   Source CANCELED     Comment: Result canceled by the ancillary.   STATUS: CANCELED     Comment: Result canceled by the ancillary.   Result: CANCELED     Comment: Result canceled by the ancillary.  ANA w/Reflex     Status: None   Collection Time: 11/01/23  3:55 PM  Result Value Ref Range   Anti Nuclear Antibody (ANA) Negative Negative  Interpretation:     Status: None   Collection Time: 11/01/23  3:55 PM  Result Value Ref Range   HCV Interp 1: Comment     Comment: Not infected with HCV unless early or acute infection is suspected (which may be delayed in an immunocompromised individual), or other evidence exists to indicate HCV infection.   C-reactive protein     Status: None   Collection Time: 11/01/23  4:35 PM  Result Value Ref Range   CRP <3.0 <8.0 mg/L  CBC w/Diff     Status: None   Collection  Time: 11/01/23  4:35 PM  Result Value Ref Range   WBC 5.0 3.8 - 10.8 Thousand/uL   RBC 4.45 4.20 - 5.80 Million/uL   Hemoglobin 14.4 13.2 - 17.1 g/dL   HCT 58.0 61.4 - 49.9 %   MCV 94.2 80.0 - 100.0 fL   MCH 32.4 27.0 - 33.0 pg   MCHC 34.4 32.0 - 36.0 g/dL    Comment: For adults, a slight decrease in the calculated MCHC value (in the range of  30 to 32 g/dL) is most likely not clinically significant; however, it should be interpreted with caution in correlation with other red cell parameters and the patient's clinical condition.    RDW 12.2 11.0 - 15.0 %   Platelets 257 140 - 400 Thousand/uL   MPV 9.7 7.5 - 12.5 fL   Neutro Abs 2,500 1,500 - 7,800 cells/uL   Absolute Lymphocytes 2,025 850 - 3,900 cells/uL   Absolute Monocytes 325 200 - 950 cells/uL   Eosinophils Absolute 110 15 - 500 cells/uL   Basophils Absolute 40 0 - 200 cells/uL   Neutrophils Relative % 50 %   Total Lymphocyte 40.5 %   Monocytes Relative 6.5 %   Eosinophils Relative 2.2 %   Basophils Relative 0.8 %  Comp Met (CMET)     Status: None   Collection Time: 11/01/23  4:35 PM  Result Value Ref Range   Glucose, Bld 84 65 - 99 mg/dL    Comment: .            Fasting reference interval .    BUN 12 7 - 25 mg/dL   Creat 9.11 9.39 - 8.70 mg/dL   eGFR 888 > OR = 60 fO/fpw/8.26f7   BUN/Creatinine Ratio SEE NOTE: 6 - 22 (calc)    Comment:    Not Reported: BUN and Creatinine are within    reference range. .    Sodium 139 135 - 146 mmol/L   Potassium 4.0 3.5 - 5.3 mmol/L   Chloride 102 98 - 110 mmol/L   CO2 29 20 - 32 mmol/L   Calcium 9.3 8.6 - 10.3 mg/dL   Total Protein 7.0 6.1 - 8.1 g/dL   Albumin 4.6 3.6 - 5.1 g/dL   Globulin 2.4 1.9 - 3.7 g/dL (calc)   AG Ratio 1.9 1.0 - 2.5 (calc)   Total Bilirubin 0.7 0.2 - 1.2 mg/dL   Alkaline phosphatase (APISO) 64 36 - 130 U/L   AST 23 10 - 40 U/L   ALT 27 9 - 46 U/L  Sedimentation rate     Status: None   Collection Time: 11/01/23  4:35 PM  Result Value Ref Range   Sed Rate 2 0 - 15 mm/h  Blood culture (routine single)     Status: None   Collection Time: 11/19/23  1:57 PM   Specimen: Blood   BLD  Result Value Ref Range   BLOOD CULTURE, ROUTINE Final report    Organism ID, Bacteria Comment     Comment: No aerobic or anaerobic growth in five days.  Uric acid     Status: None   Collection Time: 11/19/23   3:57 PM  Result Value Ref Range   Uric Acid, Serum 4.5 4.0 - 7.8 mg/dL  Resp panel by RT-PCR (RSV, Flu A&B, Covid) Anterior Nasal Swab     Status: None   Collection Time: 11/20/23 11:45 AM   Specimen: Anterior Nasal Swab  Result Value Ref Range   SARS Coronavirus 2 by RT PCR  NEGATIVE NEGATIVE    Comment: (NOTE) SARS-CoV-2 target nucleic acids are NOT DETECTED.  The SARS-CoV-2 RNA is generally detectable in upper respiratory specimens during the acute phase of infection. The lowest concentration of SARS-CoV-2 viral copies this assay can detect is 138 copies/mL. A negative result does not preclude SARS-Cov-2 infection and should not be used as the sole basis for treatment or other patient management decisions. A negative result may occur with  improper specimen collection/handling, submission of specimen other than nasopharyngeal swab, presence of viral mutation(s) within the areas targeted by this assay, and inadequate number of viral copies(<138 copies/mL). A negative result must be combined with clinical observations, patient history, and epidemiological information. The expected result is Negative.  Fact Sheet for Patients:  BloggerCourse.com  Fact Sheet for Healthcare Providers:  SeriousBroker.it  This test is no t yet approved or cleared by the [place]  FDA and  has been authorized for detection and/or diagnosis of SARS-CoV-2 by FDA under an Emergency Use Authorization (EUA). This EUA will remain  in effect (meaning this test can be used) for the duration of the COVID-19 declaration under Section 564(b)(1) of the Act, 21 U.S.C.section 360bbb-3(b)(1), unless the authorization is terminated  or revoked sooner.       Influenza A by PCR NEGATIVE NEGATIVE   Influenza B by PCR NEGATIVE NEGATIVE    Comment: (NOTE) The Xpert Xpress SARS-CoV-2/FLU/RSV plus assay is intended as an aid in the diagnosis of influenza from  Nasopharyngeal swab specimens and should not be used as a sole basis for treatment. Nasal washings and aspirates are unacceptable for Xpert Xpress SARS-CoV-2/FLU/RSV testing.  Fact Sheet for Patients: BloggerCourse.com  Fact Sheet for Healthcare Providers: SeriousBroker.it  This test is not yet approved or cleared by the [place]  FDA and has been authorized for detection and/or diagnosis of SARS-CoV-2 by FDA under an Emergency Use Authorization (EUA). This EUA will remain in effect (meaning this test can be used) for the duration of the COVID-19 declaration under Section 564(b)(1) of the Act, 21 U.S.C. section 360bbb-3(b)(1), unless the authorization is terminated or revoked.     Resp Syncytial Virus by PCR NEGATIVE NEGATIVE    Comment: (NOTE) Fact Sheet for Patients: BloggerCourse.com  Fact Sheet for Healthcare Providers: SeriousBroker.it  This test is not yet approved or cleared by the [place]  FDA and has been authorized for detection and/or diagnosis of SARS-CoV-2 by FDA under an Emergency Use Authorization (EUA). This EUA will remain in effect (meaning this test can be used) for the duration of the COVID-19 declaration under Section 564(b)(1) of the Act, 21 U.S.C. section 360bbb-3(b)(1), unless the authorization is terminated or revoked.  Performed at [facility], [address], [place], [place] [address]   CBC with Differential     Status: Abnormal   Collection Time: 11/20/23 11:50 AM  Result Value Ref Range   WBC 12.4 (H) 4.0 - 10.5 K/uL   RBC 4.61 4.22 - 5.81 MIL/uL   Hemoglobin 14.9 13.0 - 17.0 g/dL   HCT 58.8 60.9 - 47.9 %   MCV 89.2 80.0 - 100.0 fL   MCH 32.3 26.0 - 34.0 pg   MCHC 36.3 (H) 30.0 - 36.0 g/dL   RDW 88.0 88.4 - 84.4 %   Platelets 252 150 - 400 K/uL   nRBC 0.0 0.0 - 0.2 %   Neutrophils Relative % 82 %   Neutro Abs  10.1 (H) 1.7 - 7.7 K/uL   Lymphocytes Relative 11 %   Lymphs  Abs 1.4 0.7 - 4.0 K/uL   Monocytes Relative 7 %   Monocytes Absolute 0.9 0.1 - 1.0 K/uL   Eosinophils Relative 0 %   Eosinophils Absolute 0.0 0.0 - 0.5 K/uL   Basophils Relative 0 %   Basophils Absolute 0.0 0.0 - 0.1 K/uL   Immature Granulocytes 0 %   Abs Immature Granulocytes 0.03 0.00 - 0.07 K/uL    Comment: Performed at [facility], [address], [place], [place] [address]  Comprehensive metabolic panel     Status: None   Collection Time: 11/20/23 11:50 AM  Result Value Ref Range   Sodium 138 135 - 145 mmol/L   Potassium 4.1 3.5 - 5.1 mmol/L   Chloride 100 98 - 111 mmol/L   CO2 25 22 - 32 mmol/L   Glucose, Bld 84 70 - 99 mg/dL    Comment: Glucose reference range applies only to samples taken after fasting for at least 8 hours.   BUN 15 6 - 20 mg/dL   Creatinine, Ser 9.07 0.61 - 1.24 mg/dL   Calcium 89.8 8.9 - 89.6 mg/dL   Total Protein 7.5 6.5 - 8.1 g/dL   Albumin 5.0 3.5 - 5.0 g/dL   AST 32 15 - 41 U/L   ALT 39 0 - 44 U/L   Alkaline Phosphatase 83 38 - 126 U/L   Total Bilirubin 0.7 0.0 - 1.2 mg/dL   GFR, Estimated >39 >39 mL/min    Comment: (NOTE) Calculated using the CKD-EPI Creatinine Equation (2021)    Anion gap 13 5 - 15    Comment: Performed at [facility], [address] [place], [place] [address]  Troponin T, High Sensitivity     Status: None   Collection Time: 11/20/23 11:50 AM  Result Value Ref Range   Troponin T High Sensitivity <15 <19 ng/L    Comment: (NOTE) Biotin concentrations > 1000 ng/mL falsely decrease TnT results.  Serial cardiac troponin measurements are suggested.  Refer to the Links section for chest pain algorithms and additional  guidance. Performed at [facility], [address], [place], [place] [address]   Lactic acid, plasma     Status: None   Collection Time: 11/20/23 11:50 AM  Result Value Ref Range    Lactic Acid, Venous 1.5 0.5 - 1.9 mmol/L    Comment: Performed at [facility], [address], [place], [place] [address]  Mononucleosis screen     Status: None   Collection Time: 11/20/23 11:50 AM  Result Value Ref Range   Mono Screen NEGATIVE NEGATIVE    Comment: Performed at [facility], [address] [place], [place] [address]  Culture, blood (routine x 2)     Status: None   Collection Time: 11/20/23 11:55 AM   Specimen: BLOOD  Result Value Ref Range   Specimen Description      BLOOD RIGHT ANTECUBITAL Performed at [facility], [address] [place], [place] [address]    Special Requests      Blood Culture adequate volume Performed at [facility], [address], [place], [place] [address]    Culture      NO GROWTH 5 DAYS Performed at [facility], [address] [place], [place] [address]    Report Status 11/25/2023 FINAL   Culture, blood (routine x 2)     Status: None   Collection Time: 11/20/23 12:00 PM   Specimen: BLOOD  Result Value Ref Range   Specimen  Description      BLOOD LEFT ANTECUBITAL Performed at [facility], [address], [place], [place] [address]    Special Requests      Blood Culture adequate volume Performed at [facility], [address] [place], [place] [address]    Culture      NO GROWTH 5 DAYS Performed at [facility], [address], [place], [place] [address]    Report Status 11/25/2023 FINAL   Urinalysis, Routine w reflex microscopic -Urine, Clean Catch     Status: Abnormal   Collection Time: 11/20/23  1:39 PM  Result Value Ref Range   Color, Urine COLORLESS (A) YELLOW   APPearance CLEAR CLEAR   Specific Gravity, Urine 1.010 1.005 - 1.030   pH 6.5 5.0 - 8.0   Glucose, UA NEGATIVE NEGATIVE mg/dL   Hgb urine dipstick NEGATIVE NEGATIVE   Bilirubin Urine NEGATIVE NEGATIVE   Ketones, ur NEGATIVE  NEGATIVE mg/dL   Protein, ur NEGATIVE NEGATIVE mg/dL   Nitrite NEGATIVE NEGATIVE   Leukocytes,Ua NEGATIVE NEGATIVE    Comment: Performed at [facility], [address] [place], [place] [address]  Troponin T, High Sensitivity     Status: None   Collection Time: 11/20/23  2:04 PM  Result Value Ref Range   Troponin T High Sensitivity <15 <19 ng/L    Comment: (NOTE) Biotin concentrations > 1000 ng/mL falsely decrease TnT results.  Serial cardiac troponin measurements are suggested.  Refer to the Links section for chest pain algorithms and additional  guidance. Performed at [facility], [address] [place], [place] [address]   IgA     Status: None   Collection Time: 01/03/24 11:13 AM  Result Value Ref Range   Immunoglobulin A 179 47 - 310 mg/dL  VITAMIN D  25 Hydroxy (Vit-D Deficiency, Fractures)     Status: None   Collection Time: 01/03/24 11:13 AM  Result Value Ref Range   VITD 35.14 30.00 - 100.00 ng/mL  B12 and Folate Panel     Status: None   Collection Time: 01/03/24 11:13 AM  Result Value Ref Range   Vitamin B-12 397 211 - 911 pg/mL   Folate 9.9 >5.9 ng/mL  IBC + Ferritin     Status: None   Collection Time: 01/03/24 11:13 AM  Result Value Ref Range   Iron 154 42 - 165 ug/dL   Transferrin 732.9 787.9 - 360.0 mg/dL   Saturation Ratios 58.7 20.0 - 50.0 %   Ferritin 42.3 22.0 - 322.0 ng/mL   TIBC 373.8 250.0 - 450.0 mcg/dL  C-reactive protein     Status: None   Collection Time: 01/03/24 11:13 AM  Result Value Ref Range   CRP <1.0 0.5 - 20.0 mg/dL  Sedimentation rate     Status: None   Collection Time: 01/03/24 11:13 AM  Result Value Ref Range   Sed Rate 1 0 - 15 mm/hr  Comprehensive metabolic panel with GFR     Status: Abnormal   Collection Time: 01/03/24 11:13 AM  Result Value Ref Range   Sodium 138 135 - 145 mEq/L   Potassium 4.4 3.5 - 5.1 mEq/L   Chloride 100 96 - 112 mEq/L   CO2 32 19 - 32 mEq/L   Glucose, Bld 90 70  - 99 mg/dL   BUN 12 6 - 23 mg/dL   Creatinine, Ser 9.08 0.40 - 1.50 mg/dL   Total Bilirubin 1.3 (H) 0.2 - 1.2 mg/dL   Alkaline Phosphatase 56  39 - 117 U/L   AST 21 0 - 37 U/L   ALT 21 0 - 53 U/L   Total Protein 8.0 6.0 - 8.3 g/dL   Albumin 5.3 (H) 3.5 - 5.2 g/dL   GFR 895.22 >39.99 mL/min    Comment: Calculated using the CKD-EPI Creatinine Equation (2021)   Calcium 10.0 8.4 - 10.5 mg/dL  CBC with Differential/Platelet     Status: None   Collection Time: 01/03/24 11:13 AM  Result Value Ref Range   WBC 6.1 4.0 - 10.5 K/uL   RBC 5.14 4.22 - 5.81 Mil/uL   Hemoglobin 16.5 13.0 - 17.0 g/dL   HCT 53.0 60.9 - 47.9 %   MCV 91.3 78.0 - 100.0 fl   MCHC 35.2 30.0 - 36.0 g/dL   RDW 86.9 88.4 - 84.4 %   Platelets 251.0 150.0 - 400.0 K/uL   Neutrophils Relative % 61.3 43.0 - 77.0 %   Lymphocytes Relative 29.9 12.0 - 46.0 %   Monocytes Relative 7.2 3.0 - 12.0 %   Eosinophils Relative 0.8 0.0 - 5.0 %   Basophils Relative 0.8 0.0 - 3.0 %   Neutro Abs 3.7 1.4 - 7.7 K/uL   Lymphs Abs 1.8 0.7 - 4.0 K/uL   Monocytes Absolute 0.4 0.1 - 1.0 K/uL   Eosinophils Absolute 0.1 0.0 - 0.7 K/uL   Basophils Absolute 0.0 0.0 - 0.1 K/uL  Protime-INR     Status: None   Collection Time: 01/03/24 11:13 AM  Result Value Ref Range   INR 1.0 0.8 - 1.0 ratio   Prothrombin Time 11.1 9.6 - 13.1 sec  Tissue Transglutaminase Abs,IgG,IgA     Status: None   Collection Time: 01/03/24 11:13 AM  Result Value Ref Range   (tTG) Ab, IgG <1.0 U/mL    Comment: Value          Interpretation -----          -------------- <15.0          Antibody not detected > or = 15.0    Antibody detected .    (tTG) Ab, IgA <1.0 U/mL    Comment: Value          Interpretation -----          -------------- <15.0          Antibody not detected > or = 15.0    Antibody detected .         Beverley Adine Hummer, MD, MS

## 2024-01-23 ENCOUNTER — Telehealth: Payer: Self-pay | Admitting: Family Medicine

## 2024-01-23 NOTE — Telephone Encounter (Signed)
 Fmla pa

## 2024-01-28 ENCOUNTER — Other Ambulatory Visit (HOSPITAL_COMMUNITY): Payer: Self-pay

## 2024-02-04 ENCOUNTER — Other Ambulatory Visit (HOSPITAL_COMMUNITY): Payer: Self-pay

## 2024-02-04 MED ORDER — PREGABALIN 75 MG PO CAPS
75.0000 mg | ORAL_CAPSULE | Freq: Three times a day (TID) | ORAL | 0 refills | Status: DC
  Filled 2024-02-04: qty 90, 30d supply, fill #0

## 2024-02-04 MED ORDER — TIZANIDINE HCL 4 MG PO TABS
4.0000 mg | ORAL_TABLET | Freq: Two times a day (BID) | ORAL | 0 refills | Status: DC | PRN
  Filled 2024-02-04: qty 60, 30d supply, fill #0

## 2024-02-04 MED ORDER — HYDROCODONE-ACETAMINOPHEN 10-325 MG PO TABS
1.0000 | ORAL_TABLET | Freq: Every day | ORAL | 0 refills | Status: DC | PRN
  Filled 2024-02-13: qty 150, 30d supply, fill #0

## 2024-02-05 ENCOUNTER — Other Ambulatory Visit: Payer: Self-pay

## 2024-02-07 ENCOUNTER — Encounter: Payer: Self-pay | Admitting: Pediatrics

## 2024-02-10 ENCOUNTER — Telehealth: Payer: Self-pay | Admitting: Pediatrics

## 2024-02-10 NOTE — Telephone Encounter (Signed)
 Pt stated that he has to take his medication with food and requested a different date for his procedure  Pt was rescheduled to 04/09/2024 at 7:00 AM.  Pt made aware.  Pt verbalized understanding with all questions answered.

## 2024-02-10 NOTE — Telephone Encounter (Signed)
 Patient called and stated that he has medication that he is needing to take the morning of his procedure and was wondering if the nurse can give him a call back. Please advise.

## 2024-02-13 ENCOUNTER — Other Ambulatory Visit (HOSPITAL_COMMUNITY): Payer: Self-pay

## 2024-02-14 ENCOUNTER — Encounter: Admitting: Pediatrics

## 2024-03-03 NOTE — Progress Notes (Deleted)
 03/03/2024 William Oliver [id number] 30-Apr-1981  Referring provider: Sebastian Beverley NOVAK, MD Primary GI doctor: Dr. Suzann  ASSESSMENT AND PLAN:  Hematochezia Normal iron ferritin, B12 folate okay is little bit low normal function and negative sed rate, CRP negative celiac  Today nausea without vomiting 11/20/23 CT chest abdomen pelvis with contrast unremarkable  Following with [facility] rheumatology  Gilbert's syndrome  Patient Care Team: Sebastian Beverley NOVAK, MD as PCP - General (Family Medicine)  HISTORY OF PRESENT ILLNESS: 43 y.o. male with a past medical history of allergic rhinitis, asthma, plantar fascitiis, pleurisy, sciatica and others listed below presents for evaluation of ***.   Patient last seen 01/03/2024 by Dr. Suzann as a new patient.  Patient was scheduled for EGD colonoscopy at the [facility] due to symptoms of hematochezia nausea and weight loss progressive for over 5 years, scheduled 04/09/2024 for EGDcolon  *** Discussed the use of AI scribe software for clinical note transcription with the patient, who gave verbal consent to proceed.  History of Present Illness            He  reports that he has never smoked. He has never been exposed to tobacco smoke. He has never used smokeless tobacco. He reports that he does not currently use drugs. He reports that he does not drink alcohol.  RELEVANT GI HISTORY, IMAGING AND LABS: Results          CBC    Component Value Date/Time   WBC 6.1 01/03/2024 1113   RBC 5.14 01/03/2024 1113   HGB 16.5 01/03/2024 1113   HCT 46.9 01/03/2024 1113   PLT 251.0 01/03/2024 1113   MCV 91.3 01/03/2024 1113   MCV 98.1 (A) 06/05/2012 1001   MCH 32.3 11/20/2023 1150   MCHC 35.2 01/03/2024 1113   RDW 13.0 01/03/2024 1113   LYMPHSABS 1.8 01/03/2024 1113   MONOABS 0.4 01/03/2024 1113   EOSABS 0.1 01/03/2024 1113   BASOSABS 0.0 01/03/2024 1113   Recent Labs    04/30/23 0906 11/01/23 1635 11/20/23 1150 01/03/24 1113  HGB  16.1 14.4 14.9 16.5    CMP     Component Value Date/Time   NA 138 01/03/2024 1113   K 4.4 01/03/2024 1113   CL 100 01/03/2024 1113   CO2 32 01/03/2024 1113   GLUCOSE 90 01/03/2024 1113   BUN 12 01/03/2024 1113   CREATININE 0.91 01/03/2024 1113   CREATININE 0.88 11/01/2023 1635   CALCIUM 10.0 01/03/2024 1113   PROT 8.0 01/03/2024 1113   ALBUMIN 5.3 (H) 01/03/2024 1113   AST 21 01/03/2024 1113   ALT 21 01/03/2024 1113   ALKPHOS 56 01/03/2024 1113   BILITOT 1.3 (H) 01/03/2024 1113   GFRNONAA >60 11/20/2023 1150      Latest Ref Rng & Units 01/03/2024   11:13 AM 11/20/2023   11:50 AM 11/01/2023    4:35 PM  Hepatic Function  Total Protein 6.0 - 8.3 g/dL 8.0  7.5  7.0   Albumin 3.5 - 5.2 g/dL 5.3  5.0    AST 0 - 37 U/L 21  32  23   ALT 0 - 53 U/L 21  39  27   Alk Phosphatase 39 - 117 U/L 56  83    Total Bilirubin 0.2 - 1.2 mg/dL 1.3  0.7  0.7       Current Medications:     Current Outpatient Medications (Respiratory):    albuterol  (VENTOLIN  HFA) 108 (90 Base) MCG/ACT inhaler, Inhale 2 puffs  into the lungs every 6 (six) hours as needed for wheezing or shortness of breath.  Current Outpatient Medications (Analgesics):    HYDROcodone -acetaminophen  (NORCO) 10-325 MG tablet, Take 1 tablet by mouth 5 (five) times daily as needed for pain   HYDROcodone -acetaminophen  (NORCO) 10-325 MG tablet, Take 1 tablet by mouth 5 (five) times daily as needed for pain.   HYDROcodone -acetaminophen  (NORCO) 10-325 MG tablet, Take 1 tablet by mouth 5 (five) times daily as needed for pain.   meloxicam  (MOBIC ) 15 MG tablet, 1 tab by mouth daily   meloxicam  (MOBIC ) 15 MG tablet, Take 1 tablet (15 mg total) by mouth daily.   Current Outpatient Medications (Other):    azithromycin  (ZITHROMAX ) 250 MG tablet, Take 1 tablet (250 mg total) by mouth daily. Take first 2 tablets together, then 1 every day until finished. (Patient not taking: Reported on 01/21/2024)   cyclobenzaprine  (FLEXERIL ) 10 MG tablet,  Take 1/2-1 tablet (5-10 mg total) by mouth 3 (three) times daily as needed for muscle spasms. Space opioids and muscle relaxer 1-2 hours apart to avoid oversedation.   cyclobenzaprine  (FLEXERIL ) 10 MG tablet, Take 0.5-1 tablets (5-10 mg total) by mouth 3 (three) times daily as needed for muscle spasms. Space from opioids 1-2 hours per safe medication instructions.   naloxone  (NARCAN ) nasal spray 4 mg/0.1 mL, 1 (one) Spray as needed, IF FOUND UNRESPONSIVE THEN SPRAY THIS INTO NOSE AND CALL 911 IMMEDIATELY   ondansetron  (ZOFRAN -ODT) 8 MG disintegrating tablet, Take 1 tablet (8 mg total) by mouth every 8 (eight) hours as needed for nausea or vomiting.   pregabalin  (LYRICA ) 75 MG capsule, Take 1 capsule (75 mg total) by mouth 3 (three) times daily for neuropathy.   pregabalin  (LYRICA ) 75 MG capsule, Take 1 capsule (75 mg total) by mouth 3 (three) times daily for neuropathy.   pregabalin  (LYRICA ) 75 MG capsule, Take 1 capsule (75 mg total) by mouth 3 (three) times daily for neuropathy.   tiZANidine  (ZANAFLEX ) 4 MG tablet, Take 1 tablet (4 mg total) by mouth 2 (two) times daily as needed for muscle spasms.   tiZANidine  (ZANAFLEX ) 4 MG tablet, Take 1 tablet (4 mg total) by mouth 2 (two) times daily as needed for muscle spasms.  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 Family History:  His family history includes Arthritis in his mother; COPD in his father, maternal grandmother, and paternal grandmother; Cancer in his father, mother, paternal grandfather, and  paternal grandmother; Clotting disorder in his maternal grandmother; Emphysema in his maternal grandmother and paternal grandfather; Heart disease in his maternal grandmother and maternal uncle; Kidney disease in his maternal grandfather; Lung cancer in his paternal grandfather; Lupus in his mother; Throat cancer in his father; Vision loss in his father.  REVIEW OF SYSTEMS  : All other systems reviewed and negative except where noted in the History of Present Illness.  PHYSICAL EXAM: There were no vitals taken for this visit. Physical Exam          Alan JONELLE Coombs, PA-C 3:59 PM

## 2024-03-04 ENCOUNTER — Ambulatory Visit: Admitting: Physician Assistant

## 2024-03-10 ENCOUNTER — Other Ambulatory Visit (HOSPITAL_COMMUNITY): Payer: Self-pay

## 2024-03-10 MED ORDER — PREGABALIN 75 MG PO CAPS
75.0000 mg | ORAL_CAPSULE | Freq: Three times a day (TID) | ORAL | 0 refills | Status: DC
  Filled 2024-03-12: qty 90, 30d supply, fill #0

## 2024-03-10 MED ORDER — HYDROCODONE-ACETAMINOPHEN 10-325 MG PO TABS
1.0000 | ORAL_TABLET | Freq: Every day | ORAL | 0 refills | Status: AC | PRN
  Filled 2024-03-12: qty 150, 30d supply, fill #0

## 2024-03-12 ENCOUNTER — Other Ambulatory Visit (HOSPITAL_COMMUNITY): Payer: Self-pay

## 2024-03-16 ENCOUNTER — Other Ambulatory Visit (HOSPITAL_COMMUNITY): Payer: Self-pay

## 2024-04-07 NOTE — Progress Notes (Unsigned)
 [facility] History and Physical   Primary Care Physician:  William Beverley NOVAK, MD   Reason for Procedure:  Nausea, weight loss, hematochezia  Plan:    Upper endoscopy and colonoscopy     HPI: William Oliver is a 43 y.o. male undergoing upper endoscopy and colonoscopy for investigation of nausea, weight loss and hematochezia.  Patient reports a 5-year history of hematochezia which has become progressive in nature.  Currently having 1-2 bowel movements a day that fluctuate from being formed to diarrhea.  Describes seeing dark blood with more than 50% of bowel movements.  Endorses nausea but no vomiting.  Denies GERD, dysphagia or odynophagia.  At the time of clinic visit in June weight was 165 pounds down from 174 pounds.  Appears patient has regained weight since the summer.  No family history of IBD or colon cancer.   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 Authorizing Provider  albuterol  (VENTOLIN  HFA) 108 (90 Base) MCG/ACT inhaler Inhale 2 puffs into the lungs every 6 (six) hours as needed for wheezing or shortness of breath. 04/29/23   William Beverley NOVAK, MD  azithromycin  (ZITHROMAX ) 250 MG tablet Take 1 tablet (250 mg total) by mouth daily. Take first 2 tablets together, then 1 every day until finished. Patient not taking: Reported on 01/21/2024 12/01/23   William Asberry FALCON, PA-C  cyclobenzaprine  (FLEXERIL ) 10 MG tablet Take  1/2-1 tablet (5-10 mg total) by mouth 3 (three) times daily as needed for muscle spasms. Space opioids and muscle relaxer 1-2 hours apart to avoid oversedation. 08/13/23     cyclobenzaprine  (FLEXERIL ) 10 MG tablet Take 0.5-1 tablets (5-10 mg total) by mouth 3 (three) times daily as needed for muscle spasms. Space from opioids 1-2 hours per safe medication instructions. 12/09/23     HYDROcodone -acetaminophen  (NORCO) 10-325 MG tablet Take 1 tablet by mouth 5 (five) times daily as needed for pain 07/26/22     HYDROcodone -acetaminophen  (NORCO) 10-325 MG tablet Take 1 tablet by mouth 5 (five) times daily as needed for pain. 01/06/24     HYDROcodone -acetaminophen  (NORCO) 10-325 MG tablet Take 1 tablet by mouth 5 (five) times daily as needed for pain. 02/04/24     HYDROcodone -acetaminophen  (NORCO) 10-325 MG tablet Take 1 tablet by mouth 5 (five) times daily as needed for pain. 03/12/24     meloxicam  (MOBIC ) 15 MG tablet 1 tab by mouth daily 11/11/23     meloxicam  (MOBIC ) 15 MG tablet Take 1 tablet (15 mg total) by mouth daily. 12/09/23     naloxone  (NARCAN ) nasal spray 4 mg/0.1 mL 1 (one) Spray as needed, IF FOUND UNRESPONSIVE THEN SPRAY THIS INTO NOSE AND CALL 911 IMMEDIATELY 11/11/23     ondansetron  (ZOFRAN -ODT) 8 MG disintegrating tablet Take 1 tablet (8 mg total) by mouth every 8 (eight) hours as needed for nausea or vomiting. 01/21/24   William Beverley NOVAK, MD  pregabalin  (  LYRICA ) 75 MG capsule Take 1 capsule (75 mg total) by mouth 3 (three) times daily for neuropathy. 12/09/23     pregabalin  (LYRICA ) 75 MG capsule Take 1 capsule (75 mg total) by mouth 3 (three) times daily for neuropathy. 01/06/24     pregabalin  (LYRICA ) 75 MG capsule Take 1 capsule (75 mg total) by mouth 3 (three) times daily for neuropathy. 02/04/24     pregabalin  (LYRICA ) 75 MG capsule Take 1 capsule (75 mg total) by mouth 3 (three) times daily for neuropathy. 03/12/24     tiZANidine  (ZANAFLEX ) 4 MG tablet Take 1 tablet (4 mg total) by mouth 2 (two)  times daily as needed for muscle spasms. 01/06/24     tiZANidine  (ZANAFLEX ) 4 MG tablet Take 1 tablet (4 mg total) by mouth 2 (two) times daily as needed for muscle spasms. 02/04/24       Current Outpatient Medications  Medication Sig Dispense Refill   cyclobenzaprine  (FLEXERIL ) 10 MG tablet Take 1/2-1 tablet (5-10 mg total) by mouth 3 (three) times daily as needed for muscle spasms. Space opioids and muscle relaxer 1-2 hours apart to avoid oversedation. 90 tablet 0   HYDROcodone -acetaminophen  (NORCO) 10-325 MG tablet Take 1 tablet by mouth 5 (five) times daily as needed for pain. 150 tablet 0   pregabalin  (LYRICA ) 75 MG capsule Take 1 capsule (75 mg total) by mouth 3 (three) times daily for neuropathy. 90 capsule 0   meloxicam  (MOBIC ) 15 MG tablet 1 tab by mouth daily 90 tablet 0   naloxone  (NARCAN ) nasal spray 4 mg/0.1 mL 1 (one) Spray as needed, IF FOUND UNRESPONSIVE THEN SPRAY THIS INTO NOSE AND CALL 911 IMMEDIATELY 1 each 3   Current Facility-Administered Medications  Medication Dose Route Frequency Provider Last Rate Last Admin   0.9 %  sodium chloride  infusion  500 mL Intravenous Once William Kohler, [name], MD        Allergies as of 04/09/2024 - Review Complete 04/09/2024  Allergen Reaction Noted   Penicillins Hives and Swelling 02/25/2008    Family History  Problem Relation Age of Onset   Colon polyps Mother    Lupus Mother    Arthritis Mother    Cancer Mother    COPD Father    Throat cancer Father        never smoker   Cancer Father    Vision loss Father    Heart disease Maternal Uncle    Emphysema Maternal Grandmother    Clotting disorder Maternal Grandmother    COPD Maternal Grandmother    Heart disease Maternal Grandmother    Kidney disease Maternal Grandfather    Cancer Paternal Grandmother    COPD Paternal Grandmother    Lung cancer Paternal Grandfather        was a smoker   Emphysema Paternal Grandfather    Cancer Paternal Grandfather    Colon cancer Neg Hx     Esophageal cancer Neg Hx    Stomach cancer Neg Hx    Rectal cancer Neg Hx     Social History   Socioeconomic History   Marital status: Single    Spouse name: Not on file   Number of children: 0   Years of education: Not on file   Highest education level: 12th grade  Occupational History   Occupation: Biomedical engineer: [employer]  Tobacco Use   Smoking status: Never    Passive exposure: Never   Smokeless tobacco: Never  Vaping Use   Vaping status:  Never Used  Substance and Sexual Activity   Alcohol use: Yes    Comment: rare glass wine   Drug use: Never   Sexual activity: Yes    Birth control/protection: Surgical  Other Topics Concern   Not on file  Social History Narrative   Not on file   Social Drivers of Health   Financial Resource Strain: Low Risk  (09/12/2023)   Overall Financial Resource Strain (CARDIA)    Difficulty of Paying Living Expenses: Not hard at all  Food Insecurity: No Food Insecurity (09/12/2023)   Hunger Vital Sign    Worried About Running Out of Food in the Last Year: Never true    Ran Out of Food in the Last Year: Never true  Transportation Needs: No Transportation Needs (09/12/2023)   PRAPARE - Administrator, Civil Service (Medical): No    Lack of Transportation (Non-Medical): No  Physical Activity: Sufficiently Active (09/12/2023)   Exercise Vital Sign    Days of Exercise per Week: 5 days    Minutes of Exercise per Session: 30 min  Stress: No Stress Concern Present (09/12/2023)   [employer] of Occupational Health - Occupational Stress Questionnaire    Feeling of Stress : Not at all  Social Connections: Moderately Isolated (09/12/2023)   Social Connection and Isolation Panel    Frequency of Communication with Friends and Family: More than three times a week    Frequency of Social Gatherings with Friends and Family: Three times a week    Attends Religious Services: More than 4 times per year    Active Member of  Clubs or Organizations: No    Attends Engineer, structural: Not on file    Marital Status: Never married  Intimate Partner Violence: Not on file    Review of Systems:  All other review of systems negative except as mentioned in the HPI.  Physical Exam: Vital signs BP (!) 144/88   Pulse 77   Temp 98.2 F (36.8 C) (Temporal)   Resp (!) 9   Ht 5' 11 (1.803 m)   Wt 174 lb (78.9 kg)   SpO2 100%   BMI 24.27 kg/m   General:   Alert,  Well-developed, well-nourished, pleasant and cooperative in NAD Airway:  Mallampati 1 Lungs:  Clear throughout to auscultation.   Heart:  Regular rate and rhythm; no murmurs, clicks, rubs,  or gallops. Abdomen:  Soft, nontender and nondistended. Normal bowel sounds.   Neuro/Psych:  Normal mood and affect. A and O x 3  Inocente Hausen, MD [facility]

## 2024-04-09 ENCOUNTER — Ambulatory Visit: Admitting: Pediatrics

## 2024-04-09 ENCOUNTER — Encounter: Payer: Self-pay | Admitting: Pediatrics

## 2024-04-09 VITALS — BP 136/87 | HR 68 | Temp 98.2°F | Resp 13 | Ht 71.0 in | Wt 174.0 lb

## 2024-04-09 DIAGNOSIS — K644 Residual hemorrhoidal skin tags: Secondary | ICD-10-CM | POA: Diagnosis not present

## 2024-04-09 DIAGNOSIS — R634 Abnormal weight loss: Secondary | ICD-10-CM

## 2024-04-09 DIAGNOSIS — G8929 Other chronic pain: Secondary | ICD-10-CM

## 2024-04-09 DIAGNOSIS — K921 Melena: Secondary | ICD-10-CM

## 2024-04-09 DIAGNOSIS — K2951 Unspecified chronic gastritis with bleeding: Secondary | ICD-10-CM | POA: Diagnosis not present

## 2024-04-09 DIAGNOSIS — B9681 Helicobacter pylori [H. pylori] as the cause of diseases classified elsewhere: Secondary | ICD-10-CM

## 2024-04-09 DIAGNOSIS — K2950 Unspecified chronic gastritis without bleeding: Secondary | ICD-10-CM

## 2024-04-09 DIAGNOSIS — R110 Nausea: Secondary | ICD-10-CM

## 2024-04-09 DIAGNOSIS — K648 Other hemorrhoids: Secondary | ICD-10-CM

## 2024-04-09 HISTORY — DX: Other chronic pain: G89.29

## 2024-04-09 MED ORDER — SODIUM CHLORIDE 0.9 % IV SOLN: 500.0000 mL | Freq: Once | INTRAVENOUS | Status: DC

## 2024-04-09 NOTE — Progress Notes (Signed)
 Sedate, gd SR, tolerated procedure well, VSS, report to RN

## 2024-04-09 NOTE — Progress Notes (Signed)
Updated medical record with pt

## 2024-04-09 NOTE — Op Note (Signed)
 [facility] Patient Name: William Oliver Procedure Date: 04/09/2024 7:13 AM MRN: [id number] Endoscopist: Inocente Hausen , MD, [id number] Age: 43 Referring MD:  Date of Birth: 10-18-1980 Gender: Male Account #: [id number] Procedure:                Colonoscopy Indications:              Hematochezia, Weight loss, Nausea Medicines:                Monitored Anesthesia Care Procedure:                Pre-Anesthesia Assessment:                           - Prior to the procedure, a History and Physical                            was performed, and patient medications and                            allergies were reviewed. The patient's tolerance of                            previous anesthesia was also reviewed. The risks                            and benefits of the procedure and the sedation                            options and risks were discussed with the patient.                            All questions were answered, and informed consent                            was obtained. Prior Anticoagulants: The patient has                            taken no anticoagulant or antiplatelet agents. ASA                            Grade Assessment: II - A patient with mild systemic                            disease. After reviewing the risks and benefits,                            the patient was deemed in satisfactory condition to                            undergo the procedure.                           After obtaining informed consent, the colonoscope  was passed under direct vision. Throughout the                            procedure, the patient's blood pressure, pulse, and                            oxygen saturations were monitored continuously. The                            CF [id number] #[id number] was introduced through the anus                            and advanced to the terminal ileum. The colonoscopy                            was performed without  difficulty. The patient                            tolerated the procedure well. The quality of the                            bowel preparation was good. The terminal ileum,                            ileocecal valve, appendiceal orifice, and rectum                            were photographed. Scope In: 8:02:26 AM Scope Out: 8:17:16 AM Scope Withdrawal Time: 0 hours 10 minutes 40 seconds  Total Procedure Duration: 0 hours 14 minutes 50 seconds  Findings:                 Hemorrhoids were found on perianal exam.                           The digital rectal exam was normal. Pertinent                            negatives include normal sphincter tone and no                            palpable rectal lesions.                           Normal mucosa was found in the entire colon.                            Biopsies were taken with a cold forceps for                            histology.                           The terminal ileum appeared normal. Biopsies were  taken with a cold forceps for histology.                           Internal hemorrhoids were found during retroflexion. Complications:            No immediate complications. Estimated blood loss:                            Minimal. Estimated Blood Loss:     Estimated blood loss was minimal. Impression:               - Hemorrhoids found on perianal exam.                           - Normal mucosa in the entire examined colon.                            Biopsied.                           - The examined portion of the ileum was normal.                            Biopsied.                           - Internal hemorrhoids.                           - Internal and external hemorrhoids with a likely                            source of rectal bleeding and hematochezia. Recommendation:           - Discharge patient to home (ambulatory).                           - Await pathology results.                            - If rectal bleeding or hematochezia recur in the                            future can consider trial of hydrocortisone                             suppositories 25 mg per rectum twice daily                           - The findings and recommendations were discussed                            with the patient.                           - Return to GI clinic as previously scheduled.                           -  Patient has a contact number available for                            emergencies. The signs and symptoms of potential                            delayed complications were discussed with the                            patient. Return to normal activities tomorrow.                            Written discharge instructions were provided to the                            patient. Inocente Hausen, MD 04/09/2024 8:24:56 AM This report has been signed electronically.

## 2024-04-09 NOTE — Op Note (Signed)
 [facility] Patient Name: William Oliver Procedure Date: 04/09/2024 7:33 AM MRN: [id number] Endoscopist: Inocente Hausen , MD, [id number] Age: 43 Referring MD:  Date of Birth: [birth year] Gender: Male Account #: [id number] Procedure:                Upper GI endoscopy Indications:              Hematochezia, Nausea, Weight loss Medicines:                Monitored Anesthesia Care Procedure:                Pre-Anesthesia Assessment:                           - Prior to the procedure, a History and Physical                            was performed, and patient medications and                            allergies were reviewed. The patient's tolerance of                            previous anesthesia was also reviewed. The risks                            and benefits of the procedure and the sedation                            options and risks were discussed with the patient.                            All questions were answered, and informed consent                            was obtained. Prior Anticoagulants: The patient has                            taken no anticoagulant or antiplatelet agents. ASA                            Grade Assessment: II - A patient with mild systemic                            disease. After reviewing the risks and benefits,                            the patient was deemed in satisfactory condition to                            undergo the procedure.                           After obtaining informed consent, the endoscope was  passed under direct vision. Throughout the                            procedure, the patient's blood pressure, pulse, and                            oxygen saturations were monitored continuously. The                            GIF HQ190 #[id number] was introduced through the                            mouth, and advanced to the second part of duodenum.                            The upper GI endoscopy was  accomplished without                            difficulty. The patient tolerated the procedure                            well. Scope In: Scope Out: Findings:                 The examined esophagus was normal.                           The gastric body, gastric antrum, cardia (on                            retroflexion) and gastric fundus (on retroflexion)                            were normal. Biopsies were taken with a cold                            forceps for Helicobacter pylori testing.                           The duodenal bulb and second portion of the                            duodenum were normal. Biopsies for histology were                            taken with a cold forceps for evaluation of celiac                            disease. Complications:            No immediate complications. Estimated blood loss:                            Minimal. Estimated Blood Loss:     Estimated blood loss was minimal. Impression:               -  Normal esophagus.                           - Normal gastric body, antrum, cardia and gastric                            fundus. Biopsied.                           - Normal duodenal bulb and second portion of the                            duodenum. Biopsied. Recommendation:           - Await pathology results.                           - Perform a colonoscopy today.                           - The findings and recommendations were discussed                            with the patient. Inocente Hausen, MD 04/09/2024 8:20:50 AM This report has been signed electronically.

## 2024-04-09 NOTE — Patient Instructions (Signed)
 Resume previous diet.  Continue present medications.  Awaiting pathology results. Handout provided on hemorrhoids.   YOU HAD AN ENDOSCOPIC PROCEDURE TODAY AT THE [facility]:   Refer to the procedure report that was given to you for any specific questions about what was found during the examination.  If the procedure report does not answer your questions, please call your gastroenterologist to clarify.  If you requested that your care partner not be given the details of your procedure findings, then the procedure report has been included in a sealed envelope for you to review at your convenience later.  YOU SHOULD EXPECT: Some feelings of bloating in the abdomen. Passage of more gas than usual.  Walking can help get rid of the air that was put into your GI tract during the procedure and reduce the bloating. If you had a lower endoscopy (such as a colonoscopy or flexible sigmoidoscopy) you may notice spotting of blood in your stool or on the toilet paper. If you underwent a bowel prep for your procedure, you may not have a normal bowel movement for a few days.  Please Note:  You might notice some irritation and congestion in your nose or some drainage.  This is from the oxygen used during your procedure.  There is no need for concern and it should clear up in a day or so.  SYMPTOMS TO REPORT IMMEDIATELY:  Following lower endoscopy (colonoscopy or flexible sigmoidoscopy):  Excessive amounts of blood in the stool  Significant tenderness or worsening of abdominal pains  Swelling of the abdomen that is new, acute  Fever of 100F or higher  Following upper endoscopy (EGD)  Vomiting of blood or coffee ground material  New chest pain or pain under the shoulder blades  Painful or persistently difficult swallowing  New shortness of breath  Fever of 100F or higher  Black, tarry-looking stools  For urgent or emergent issues, a gastroenterologist can be reached at any hour by calling  [phone]. Do not use MyChart messaging for urgent concerns.    DIET:  We do recommend a small meal at first, but then you may proceed to your regular diet.  Drink plenty of fluids but you should avoid alcoholic beverages for 24 hours.  ACTIVITY:  You should plan to take it easy for the rest of today and you should NOT DRIVE or use heavy machinery until tomorrow (because of the sedation medicines used during the test).    FOLLOW UP: Our staff will call the number listed on your records the next business day following your procedure.  We will call around 7:15- 8:00 am to check on you and address any questions or concerns that you may have regarding the information given to you following your procedure. If we do not reach you, we will leave a message.     If any biopsies were taken you will be contacted by phone or by letter within the next 1-3 weeks.  Please call us  at [phone] if you have not heard about the biopsies in 3 weeks.    SIGNATURES/CONFIDENTIALITY: You and/or your care partner have signed paperwork which will be entered into your electronic medical record.  These signatures attest to the fact that that the information above on your After Visit Summary has been reviewed and is understood.  Full responsibility of the confidentiality of this discharge information lies with you and/or your care-partner.

## 2024-04-09 NOTE — Progress Notes (Signed)
 Called to room to assist during endoscopic procedure.  Patient ID and intended procedure confirmed with present staff. Received instructions for my participation in the procedure from the performing physician.

## 2024-04-10 ENCOUNTER — Telehealth: Payer: Self-pay | Admitting: Lactation Services

## 2024-04-10 ENCOUNTER — Other Ambulatory Visit (HOSPITAL_COMMUNITY): Payer: Self-pay

## 2024-04-10 MED ORDER — PREGABALIN 75 MG PO CAPS
75.0000 mg | ORAL_CAPSULE | Freq: Three times a day (TID) | ORAL | 0 refills | Status: DC
  Filled 2024-04-13: qty 90, 30d supply, fill #0

## 2024-04-10 MED ORDER — MELOXICAM 15 MG PO TABS
15.0000 mg | ORAL_TABLET | Freq: Every day | ORAL | 0 refills | Status: AC
  Filled 2024-04-10: qty 90, 90d supply, fill #0

## 2024-04-10 MED ORDER — HYDROCODONE-ACETAMINOPHEN 10-325 MG PO TABS
1.0000 | ORAL_TABLET | Freq: Every day | ORAL | 0 refills | Status: DC
  Filled 2024-04-13: qty 150, 30d supply, fill #0

## 2024-04-10 NOTE — Telephone Encounter (Signed)
  Follow up Call-     04/09/2024    7:25 AM  Call back number  Post procedure Call Back phone  # [phone]  Permission to leave phone message Yes     Patient questions:  Do you have a fever, pain , or abdominal swelling? No. Pain Score  0 *  Have you tolerated food without any problems? Yes.    Have you been able to return to your normal activities? Yes.    Do you have any questions about your discharge instructions: Diet   No. Medications  No. Follow up visit  No.  Do you have questions or concerns about your Care? No.  Actions: * If pain score is 4 or above: No action needed, pain <4.

## 2024-04-13 ENCOUNTER — Other Ambulatory Visit (HOSPITAL_COMMUNITY): Payer: Self-pay

## 2024-04-13 ENCOUNTER — Ambulatory Visit: Payer: Self-pay | Admitting: Pediatrics

## 2024-04-13 ENCOUNTER — Telehealth: Payer: Self-pay

## 2024-04-13 DIAGNOSIS — A048 Other specified bacterial intestinal infections: Secondary | ICD-10-CM

## 2024-04-13 LAB — SURGICAL PATHOLOGY

## 2024-04-13 MED ORDER — BIS SUBCIT-METRONID-TETRACYC 140-125-125 MG PO CAPS: 3.0000 | ORAL_CAPSULE | Freq: Three times a day (TID) | ORAL | 0 refills | Status: AC

## 2024-04-13 MED ORDER — OMEPRAZOLE 20 MG PO CPDR: 20.0000 mg | DELAYED_RELEASE_CAPSULE | Freq: Two times a day (BID) | ORAL | 0 refills | Status: DC

## 2024-04-13 NOTE — Telephone Encounter (Signed)
 Pharmacy Patient Advocate Encounter   Received notification from Physician's Office that prior authorization for Pylera Capsules is required/requested.   Insurance verification completed.   The patient is insured through [facility] .   Per test claim: The current 10 day co-pay is, $50.00**.  No PA needed at this time. This test claim was processed through [facility]- copay amounts may vary at other pharmacies due to pharmacy/plan contracts, or as the patient moves through the different stages of their insurance plan.     Pharmacy does need to order so will not be ready for pick up until late afternoon of 04-14-2024

## 2024-04-13 NOTE — Addendum Note (Signed)
 Addended by: Tambria Pfannenstiel N on: 04/13/2024 02:31 PM   Modules accepted: Orders

## 2024-04-13 NOTE — Telephone Encounter (Signed)
 Noted
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
## 2024-04-28 ENCOUNTER — Encounter: Admitting: Rheumatology

## 2024-04-28 DIAGNOSIS — R519 Headache, unspecified: Secondary | ICD-10-CM

## 2024-04-28 DIAGNOSIS — J4520 Mild intermittent asthma, uncomplicated: Secondary | ICD-10-CM

## 2024-04-28 DIAGNOSIS — M47816 Spondylosis without myelopathy or radiculopathy, lumbar region: Secondary | ICD-10-CM

## 2024-04-28 DIAGNOSIS — K921 Melena: Secondary | ICD-10-CM

## 2024-04-28 DIAGNOSIS — J3089 Other allergic rhinitis: Secondary | ICD-10-CM

## 2024-04-28 DIAGNOSIS — M4126 Other idiopathic scoliosis, lumbar region: Secondary | ICD-10-CM

## 2024-04-28 DIAGNOSIS — G8929 Other chronic pain: Secondary | ICD-10-CM

## 2024-04-28 DIAGNOSIS — M064 Inflammatory polyarthropathy: Secondary | ICD-10-CM

## 2024-04-28 DIAGNOSIS — M722 Plantar fascial fibromatosis: Secondary | ICD-10-CM

## 2024-05-12 ENCOUNTER — Other Ambulatory Visit (HOSPITAL_COMMUNITY): Payer: Self-pay

## 2024-05-12 MED ORDER — CYCLOBENZAPRINE HCL 10 MG PO TABS
5.0000 mg | ORAL_TABLET | Freq: Three times a day (TID) | ORAL | 0 refills | Status: DC | PRN
  Filled 2024-05-12: qty 90, 30d supply, fill #0

## 2024-05-12 MED ORDER — HYDROCODONE-ACETAMINOPHEN 10-325 MG PO TABS
1.0000 | ORAL_TABLET | Freq: Every day | ORAL | 0 refills | Status: AC
  Filled 2024-05-13: qty 150, 30d supply, fill #0

## 2024-05-12 MED ORDER — PREGABALIN 75 MG PO CAPS
75.0000 mg | ORAL_CAPSULE | Freq: Three times a day (TID) | ORAL | 0 refills | Status: DC
  Filled 2024-05-13: qty 90, 30d supply, fill #0

## 2024-05-13 ENCOUNTER — Other Ambulatory Visit (HOSPITAL_COMMUNITY): Payer: Self-pay
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
## 2024-05-25 ENCOUNTER — Other Ambulatory Visit (HOSPITAL_COMMUNITY): Payer: Self-pay

## 2024-05-25 ENCOUNTER — Ambulatory Visit: Admitting: Physician Assistant

## 2024-05-25 ENCOUNTER — Encounter: Payer: Self-pay | Admitting: Radiology

## 2024-05-25 ENCOUNTER — Other Ambulatory Visit

## 2024-05-25 ENCOUNTER — Encounter: Payer: Self-pay | Admitting: Physician Assistant

## 2024-05-25 VITALS — BP 120/66 | HR 78 | Ht 71.0 in | Wt 173.1 lb

## 2024-05-25 DIAGNOSIS — K921 Melena: Secondary | ICD-10-CM | POA: Diagnosis not present

## 2024-05-25 DIAGNOSIS — R634 Abnormal weight loss: Secondary | ICD-10-CM | POA: Diagnosis not present

## 2024-05-25 DIAGNOSIS — R110 Nausea: Secondary | ICD-10-CM

## 2024-05-25 DIAGNOSIS — A048 Other specified bacterial intestinal infections: Secondary | ICD-10-CM

## 2024-05-25 MED ORDER — OMEPRAZOLE 20 MG PO CPDR
20.0000 mg | DELAYED_RELEASE_CAPSULE | Freq: Two times a day (BID) | ORAL | 1 refills | Status: AC
  Filled 2024-05-25: qty 180, 90d supply, fill #0

## 2024-05-25 NOTE — Patient Instructions (Addendum)
 Your provider has requested that you go to the basement level for lab work before leaving today. Press B on the elevator. The lab is located at the first door on the left as you exit the elevator.  Please take your proton pump inhibitor medication, omeprazole  20 mg twice a day Please take this medication 30 minutes to 1 hour before meals- this makes it more effective.  Avoid spicy and acidic foods Avoid fatty foods Limit your intake of coffee, tea, alcohol, and carbonated drinks Work to maintain a healthy weight Keep the head of the bed elevated at least 3 inches with blocks or a wedge pillow if you are having any nighttime symptoms Stay upright for 2 hours after eating Avoid meals and snacks three to four hours before bedtime   Helicobacter pylori infection occurs when a type of bacteria called H. pylori infects a person's stomach and causes inflammation.  This bacteria if left untreated can cause ulcers and can increase the chances of stomach cancer.  What are the symptoms? ?Pain or discomfort in the upper belly ?Feeling full after eating a small amount of food ?Not feeling hungry ?Nausea or vomiting ?Dark or black-colored bowel movements ?Feeling more tired than usual  Since this is a bacteria that thrives in a harsh, acid environment, the treatment consist of a medication to decrease the acid in your stomach and different types of antibiotics.  It is VERY important to take the medication as prescribed and finish the course to assure the bacteria is treated.  If you have an issue with the medication prescribed please call our office so we can help.  We will do a eradication test or test to assure that you test negative for the bacteria 6-8 weeks after you complete treatment with a stools sample, breath test or upper endoscopy with biopsy.   Can H. pylori be prevented? H. pylori infection can be passed from one person to another. You may want to get other members of your  household checked. Some ways to help keep others safe: ?Avoid touching the saliva, vomit, or bowel movements of anyone who has H. pylori infection. ?Wash your hands with soap and water. This is especially important after going to the bathroom or changing diapers and before cooking or preparing food. ?Only drink water from safe sources. Avoid bathing or swimming in contaminated water.  Call for advice if: ?You vomit and notice blood or something that looks like coffee grounds. ?Your stools have blood in them or are black or tar colored. ?You start having severe pain in your belly. ?Your belly becomes hard or swollen or hurts if you press on it. ?You start to feel very weak, lightheaded, or like you might pass out.  Recommend starting on a fiber supplement, can try metamucil first but if this causes gas/bloating switch to benefiber or citracel, these do not cause gas.  Take with fiber with with a full 8 oz glass of water once a day. This can take 1 month to start helping, so try for at least one month.  Recommend increasing water and physical activity.   Miralax is an osmotic laxative.  It only brings more water into the stool.  This is safe to take daily.  Can take up to 17 gram of miralax twice a day.  Mix with juice or coffee.  Start 1 capful at night for 3-4 days and reassess your response in 3-4 days.  You can increase and decrease the dose based on your response.  Remember, it can take up to 3-4 days to take effect OR for the effects to wear off.   I often pair this with benefiber in the morning to help assure the stool is not too loose.    - Drink at least 64-80 ounces of water/liquid per day. - Establish a time to try to move your bowels every day.  For many people, this is after a cup of coffee or after a meal such as breakfast. - Sit all of the way back on the toilet keeping your back fairly straight and while sitting up, try to rest the tops of your forearms on your upper  thighs.   - Raising your feet with a step stool/squatty potty can be helpful to improve the angle that allows your stool to pass through the rectum. - Relax the rectum feeling it bulge toward the toilet water.  If you feel your rectum raising toward your body, you are contracting rather than relaxing. - Breathe in and slowly exhale. Belly breath by expanding your belly towards your belly button. Keep belly expanded as you gently direct pressure down and back to the anus.  A low pitched GRRR sound can assist with increasing intra-abdominal pressure.  (Can also trying to blow on a pinwheel and make it move, this helps with the same belly breathing) - Repeat 3-4 times. If unsuccessful, contract the pelvic floor to restore normal tone and get off the toilet.  Avoid excessive straining. - To reduce excessive wiping by teaching your anus to normally contract, place hands on outer aspect of knees and resist knee movement outward.  Hold 5-10 second then place hands just inside of knees and resist inward movement of knees.  Hold 5 seconds.  Repeat a few times each way.  Go to the ER if unable to pass gas, severe AB pain, unable to hold down food, any shortness of breath of chest pain.  Gastroparesis Gastroparesis is a condition in which food takes longer than normal to empty from the stomach.  This condition is also known as delayed gastric emptying. It is usually a long-term (chronic) condition.  What are the signs or symptoms? Symptoms of this condition include: Feeling full after eating very little or a loss of appetite. Nausea, vomiting, or heartburn. Bloating of your abdomen. Inconsistent blood sugar (glucose) levels on blood tests. Unexplained weight loss. Acid from the stomach coming up into the esophagus (gastroesophageal reflux). Sudden tightening (spasm) of the stomach, which can be painful. Symptoms may come and go. Some people may not notice any symptoms.  What increases the risk? You  are more likely to develop this condition if: You have certain disorders or diseases. These may include: An endocrine disorder. An eating disorder. Amyloidosis. Scleroderma. Parkinson's disease. Multiple sclerosis. Cancer or infection of the stomach or the vagus nerve. You have had surgery on your stomach or vagus nerve. You take certain medicines. You are male.  Things you can do: Please do small frequent meals like 4-6 meals a day.  Eat and drink liquids at separate times.  Avoid high fiber foods, cook your vegetables, avoid high fat food.  Suggest spreading protein throughout the day (greek yogurt, glucerna, soft meat, milk, eggs) Choose soft foods that you can mash with a fork When you are more symptomatic, change to pureed foods foods and liquids.  Consider reading Living well with Gastroparesis by [facility] Check out this link to a diet online https://my.groupjournal.fr  Small intestinal bacterial overgrowth (SIBO) occurs when  there is an abnormal increase in the overall bacterial population in the small intestine -- particularly types of bacteria not commonly found in that part of the digestive tract. Small intestinal bacterial overgrowth (SIBO) commonly results when a circumstance -- such as surgery or disease -- slows the passage of food and waste products in the digestive tract, creating a breeding ground for bacteria.  Signs and symptoms of SIBO often include: Loss of appetite Abdominal pain Nausea Bloating An uncomfortable feeling of fullness after eating Diarrhea or constipation, depending on the type of gas produced  What foods trigger SIBO? While foods aren't the original cause of SIBO, certain foods do encourage the overgrowth of the wrong bacteria in your small intestine. If you're feeding them their favorite foods, they're going to grow more, and that will  trigger more of your SIBO symptoms. By the same token, you can help reduce the overgrowth by starving the problematic bacteria of their favorite foods. This strategy has led to a number of proposed SIBO eating plans. The plans vary, and so do individual results. But in general, they tend to recommend limiting carbohydrates.  These include: Sugars and sweeteners. Fruits and starchy vegetables. Dairy products. Grains.  There is a test for this we can do called a breath test, if you are positive we will treat you with an antibiotic to see if it helps.  Your symptoms are very suspicious for this condition, as discussed, we will start you on an antibiotic to see if this helps.

## 2024-05-26 ENCOUNTER — Ambulatory Visit: Admitting: Rheumatology

## 2024-06-02 ENCOUNTER — Other Ambulatory Visit (HOSPITAL_COMMUNITY): Payer: Self-pay

## 2024-06-08 ENCOUNTER — Telehealth: Payer: Self-pay

## 2024-06-08 NOTE — Telephone Encounter (Signed)
-----   Message from Nurse Nanakuli B sent at 04/13/2024  2:42 PM EDT ----- Regarding: [facility] H. pylori stool [facility]rix H. pylori stool/need to order/[employer]

## 2024-06-09 NOTE — Telephone Encounter (Signed)
 Patient was seen in office on 05/25/24 - H. Pylori stool test was ordered but patient has not submitted. MyChart message sent to patient with reminder.

## 2024-06-10 ENCOUNTER — Other Ambulatory Visit (HOSPITAL_COMMUNITY): Payer: Self-pay

## 2024-06-10 MED ORDER — HYDROCODONE-ACETAMINOPHEN 10-325 MG PO TABS
1.0000 | ORAL_TABLET | Freq: Every day | ORAL | 0 refills | Status: DC | PRN
  Filled 2024-06-10: qty 150, 30d supply, fill #0

## 2024-06-16 ENCOUNTER — Other Ambulatory Visit (HOSPITAL_COMMUNITY): Payer: Self-pay

## 2024-06-17 ENCOUNTER — Other Ambulatory Visit (HOSPITAL_COMMUNITY): Payer: Self-pay

## 2024-07-10 ENCOUNTER — Other Ambulatory Visit (HOSPITAL_COMMUNITY): Payer: Self-pay

## 2024-07-10 MED ORDER — HYDROCODONE-ACETAMINOPHEN 10-325 MG PO TABS
1.0000 | ORAL_TABLET | Freq: Every day | ORAL | 0 refills | Status: DC | PRN
  Filled 2024-07-15: qty 150, 30d supply, fill #0

## 2024-07-10 MED ORDER — CYCLOBENZAPRINE HCL 10 MG PO TABS
5.0000 mg | ORAL_TABLET | Freq: Three times a day (TID) | ORAL | 0 refills | Status: AC | PRN
  Filled 2024-07-10: qty 90, 30d supply, fill #0

## 2024-07-10 MED ORDER — PREGABALIN 75 MG PO CAPS
75.0000 mg | ORAL_CAPSULE | Freq: Three times a day (TID) | ORAL | 0 refills | Status: DC
  Filled 2024-07-10: qty 90, 30d supply, fill #0

## 2024-07-13 ENCOUNTER — Other Ambulatory Visit (HOSPITAL_COMMUNITY): Payer: Self-pay

## 2024-07-15 ENCOUNTER — Other Ambulatory Visit (HOSPITAL_COMMUNITY): Payer: Self-pay

## 2024-08-08 ENCOUNTER — Other Ambulatory Visit (HOSPITAL_COMMUNITY): Payer: Self-pay

## 2024-08-08 MED ORDER — PREGABALIN 75 MG PO CAPS
75.0000 mg | ORAL_CAPSULE | Freq: Three times a day (TID) | ORAL | 0 refills | Status: AC
  Filled 2024-08-08 – 2024-08-10 (×2): qty 90, 30d supply, fill #0

## 2024-08-08 MED ORDER — HYDROCODONE-ACETAMINOPHEN 10-325 MG PO TABS
1.0000 | ORAL_TABLET | Freq: Every day | ORAL | 0 refills | Status: AC | PRN
  Filled 2024-08-17: qty 150, 30d supply, fill #0

## 2024-08-10 ENCOUNTER — Other Ambulatory Visit (HOSPITAL_COMMUNITY): Payer: Self-pay

## 2024-08-10 ENCOUNTER — Other Ambulatory Visit: Payer: Self-pay

## 2024-08-17 ENCOUNTER — Other Ambulatory Visit (HOSPITAL_COMMUNITY): Payer: Self-pay
